# Patient Record
Sex: Male | Born: 1980 | Race: Black or African American | Hispanic: No | Marital: Married | Smoking: Current every day smoker
Health system: Southern US, Community
[De-identification: ages and names within clinical notes are randomized; demographics above are authoritative.]

## PROBLEM LIST (undated history)

## (undated) DIAGNOSIS — K219 Gastro-esophageal reflux disease without esophagitis: Secondary | ICD-10-CM

## (undated) DIAGNOSIS — R51 Headache: Secondary | ICD-10-CM

## (undated) DIAGNOSIS — I10 Essential (primary) hypertension: Secondary | ICD-10-CM

## (undated) DIAGNOSIS — R519 Headache, unspecified: Secondary | ICD-10-CM

## (undated) DIAGNOSIS — E785 Hyperlipidemia, unspecified: Secondary | ICD-10-CM

## (undated) DIAGNOSIS — Z9189 Other specified personal risk factors, not elsewhere classified: Secondary | ICD-10-CM

## (undated) HISTORY — DX: Essential (primary) hypertension: I10

## (undated) HISTORY — DX: Headache: R51

## (undated) HISTORY — DX: Other specified personal risk factors, not elsewhere classified: Z91.89

## (undated) HISTORY — DX: Headache, unspecified: R51.9

## (undated) HISTORY — DX: Gastro-esophageal reflux disease without esophagitis: K21.9

## (undated) HISTORY — DX: Hyperlipidemia, unspecified: E78.5

---
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
## 2005-11-25 ENCOUNTER — Emergency Department (HOSPITAL_COMMUNITY): Admission: EM | Admit: 2005-11-25 | Discharge: 2005-11-25 | Payer: Self-pay | Admitting: Emergency Medicine

## 2007-09-12 ENCOUNTER — Emergency Department (HOSPITAL_COMMUNITY): Admission: EM | Admit: 2007-09-12 | Discharge: 2007-09-12 | Payer: Self-pay | Admitting: Emergency Medicine

## 2009-05-03 ENCOUNTER — Emergency Department (HOSPITAL_COMMUNITY): Admission: EM | Admit: 2009-05-03 | Discharge: 2009-05-03 | Payer: Self-pay | Admitting: Emergency Medicine

## 2010-12-24 ENCOUNTER — Ambulatory Visit (INDEPENDENT_AMBULATORY_CARE_PROVIDER_SITE_OTHER): Payer: BC Managed Care – PPO

## 2010-12-24 ENCOUNTER — Inpatient Hospital Stay (INDEPENDENT_AMBULATORY_CARE_PROVIDER_SITE_OTHER)
Admission: RE | Admit: 2010-12-24 | Discharge: 2010-12-24 | Disposition: A | Discharge status: Home / Self Care | Payer: BC Managed Care – PPO | Source: Ambulatory Visit | Attending: Emergency Medicine | Admitting: Emergency Medicine

## 2010-12-24 DIAGNOSIS — S62309A Unspecified fracture of unspecified metacarpal bone, initial encounter for closed fracture: Secondary | ICD-10-CM

## 2011-01-21 ENCOUNTER — Emergency Department (HOSPITAL_COMMUNITY)
Admission: EM | Admit: 2011-01-21 | Discharge: 2011-01-22 | Disposition: A | Discharge status: Other Acute Inpatient Hospital | Payer: BC Managed Care – PPO | Attending: Emergency Medicine | Admitting: Emergency Medicine

## 2011-01-21 DIAGNOSIS — F17200 Nicotine dependence, unspecified, uncomplicated: Secondary | ICD-10-CM | POA: Insufficient documentation

## 2011-01-21 DIAGNOSIS — R45851 Suicidal ideations: Secondary | ICD-10-CM | POA: Insufficient documentation

## 2011-01-21 DIAGNOSIS — F3289 Other specified depressive episodes: Secondary | ICD-10-CM | POA: Insufficient documentation

## 2011-01-21 DIAGNOSIS — F329 Major depressive disorder, single episode, unspecified: Secondary | ICD-10-CM | POA: Insufficient documentation

## 2011-01-21 LAB — CBC
HCT: 46.1 % (ref 39.0–52.0)
Hemoglobin: 16.3 g/dL (ref 13.0–17.0)
MCH: 30 pg (ref 26.0–34.0)
MCHC: 35.4 g/dL (ref 30.0–36.0)
Platelets: 188 10*3/uL (ref 150–400)
RBC: 5.43 MIL/uL (ref 4.22–5.81)
RDW: 13.1 % (ref 11.5–15.5)

## 2011-01-21 LAB — POCT I-STAT, CHEM 8
BUN: 14 mg/dL (ref 6–23)
Calcium, Ion: 1.23 mmol/L (ref 1.12–1.32)
Chloride: 104 mEq/L (ref 96–112)
Creatinine, Ser: 1.2 mg/dL (ref 0.50–1.35)
Glucose, Bld: 89 mg/dL (ref 70–99)
Hemoglobin: 17.7 g/dL — ABNORMAL HIGH (ref 13.0–17.0)
Potassium: 3.8 mEq/L (ref 3.5–5.1)
Sodium: 144 mEq/L (ref 135–145)

## 2011-01-21 LAB — DIFFERENTIAL
Basophils Absolute: 0 10*3/uL (ref 0.0–0.1)
Basophils Relative: 0 % (ref 0–1)
Eosinophils Relative: 2 % (ref 0–5)

## 2011-01-21 LAB — RAPID URINE DRUG SCREEN, HOSP PERFORMED
Amphetamines: NOT DETECTED
Barbiturates: NOT DETECTED
Tetrahydrocannabinol: NOT DETECTED

## 2011-01-22 ENCOUNTER — Inpatient Hospital Stay (HOSPITAL_COMMUNITY)
Admission: AD | Admit: 2011-01-22 | Discharge: 2011-01-28 | DRG: 430 | Disposition: A | Discharge status: Home / Self Care | Payer: BC Managed Care – PPO | Source: Ambulatory Visit | Attending: Psychiatry | Admitting: Psychiatry

## 2011-01-22 DIAGNOSIS — R45851 Suicidal ideations: Secondary | ICD-10-CM

## 2011-01-22 DIAGNOSIS — F1010 Alcohol abuse, uncomplicated: Secondary | ICD-10-CM

## 2011-01-22 DIAGNOSIS — F3130 Bipolar disorder, current episode depressed, mild or moderate severity, unspecified: Principal | ICD-10-CM

## 2011-01-22 DIAGNOSIS — Z818 Family history of other mental and behavioral disorders: Secondary | ICD-10-CM

## 2011-01-22 DIAGNOSIS — F429 Obsessive-compulsive disorder, unspecified: Secondary | ICD-10-CM

## 2011-01-23 DIAGNOSIS — F314 Bipolar disorder, current episode depressed, severe, without psychotic features: Secondary | ICD-10-CM

## 2011-01-23 DIAGNOSIS — F429 Obsessive-compulsive disorder, unspecified: Secondary | ICD-10-CM

## 2011-01-24 LAB — T4, FREE: Free T4: 1.18 ng/dL (ref 0.80–1.80)

## 2011-01-27 LAB — LITHIUM LEVEL: Lithium Lvl: 0.44 mEq/L — ABNORMAL LOW (ref 0.80–1.40)

## 2011-01-28 ENCOUNTER — Other Ambulatory Visit (HOSPITAL_COMMUNITY): Payer: BC Managed Care – PPO | Admitting: Psychiatry

## 2011-01-29 ENCOUNTER — Other Ambulatory Visit (HOSPITAL_COMMUNITY): Payer: BC Managed Care – PPO | Attending: Psychiatry | Admitting: Psychiatry

## 2011-01-29 DIAGNOSIS — Z818 Family history of other mental and behavioral disorders: Secondary | ICD-10-CM | POA: Insufficient documentation

## 2011-01-29 DIAGNOSIS — Z6379 Other stressful life events affecting family and household: Secondary | ICD-10-CM | POA: Insufficient documentation

## 2011-01-29 DIAGNOSIS — F3160 Bipolar disorder, current episode mixed, unspecified: Secondary | ICD-10-CM | POA: Insufficient documentation

## 2011-01-29 DIAGNOSIS — F1010 Alcohol abuse, uncomplicated: Secondary | ICD-10-CM | POA: Insufficient documentation

## 2011-01-29 DIAGNOSIS — F429 Obsessive-compulsive disorder, unspecified: Secondary | ICD-10-CM | POA: Insufficient documentation

## 2011-01-30 ENCOUNTER — Other Ambulatory Visit (HOSPITAL_COMMUNITY): Payer: BC Managed Care – PPO | Admitting: Psychiatry

## 2011-01-31 ENCOUNTER — Other Ambulatory Visit (HOSPITAL_COMMUNITY): Payer: BC Managed Care – PPO | Admitting: Psychiatry

## 2011-02-01 ENCOUNTER — Other Ambulatory Visit (HOSPITAL_COMMUNITY): Payer: BC Managed Care – PPO | Admitting: Psychiatry

## 2011-02-04 ENCOUNTER — Other Ambulatory Visit (HOSPITAL_BASED_OUTPATIENT_CLINIC_OR_DEPARTMENT_OTHER): Payer: BC Managed Care – PPO | Admitting: Psychiatry

## 2011-02-04 DIAGNOSIS — F319 Bipolar disorder, unspecified: Secondary | ICD-10-CM

## 2011-02-05 ENCOUNTER — Other Ambulatory Visit (HOSPITAL_COMMUNITY): Payer: BC Managed Care – PPO | Admitting: Psychiatry

## 2011-02-05 NOTE — Assessment & Plan Note (Signed)
Donald Sweeney            ACCOUNT NO.:  [id number]  MEDICAL RECORD NO.:  [id number]  LOCATION:  0301                          FACILITY:  [facility]  PHYSICIAN:  Donald Ditch, MD DATE OF BIRTH:  [birth year]  DATE OF ADMISSION:  01/22/2011 DATE OF DISCHARGE:                      PSYCHIATRIC ADMISSION ASSESSMENT   IDENTIFICATION:  This is a 30 year old African American male.  This is a voluntary admission.  HISTORY OF PRESENT ILLNESS:  This is the first inpatient psychiatric admission for Donald Sweeney, a 30 year old, who presented at [facility] with increasing suicidal thoughts.  He attempted suicide last week by overdosing on pain pills and drinking to the point of intoxication.  He reports that CPR was being performed on him by hotel staff, but when he became fully oriented, he took off prior to EMS arriving.  He admits that he needs help and endorses a long history of depression.  He reports he has attempted suicide in the past, also by overdose, but has never been hospitalized or actually treated.  He denies substance abuse and denies any homicidal thoughts.  He does endorse having visions of him stabbing himself in the hand with a knife, has frequent flashes of harming himself and can visualize himself killing himself.  He reports episodes of increased motor activity from time to time that will last 1- 3 days at a time and are accompanied by racing thoughts.  Predominately, he has periods of very low mood with a hopelessness, anhedonia and constantly maintains a variety of suicidal thoughts in his head that he has not acted on.  PAST PSYCHIATRIC HISTORY:  First inpatient psychiatric admission.  He reports a history of depression as long as he can remember, even into childhood and when he never really understood the happiness the other children described, related to various events.  Never had a sense of excitement in his life.  His last suicide attempt was about 7 years  ago, then 1 week ago; both of them by overdose.  Trigger 7 years ago was getting out of the [employer].  He also describes a history of some of obsessive thinking including songs that get stuck in his head and that will bother him for 1-2 days.  He denies any history of substance abuse.  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  No legal problems.  FAMILY HISTORY:  Mother with a history of bipolar disorder, with multiple medication trials.Marland Kitchen  PAST MEDICAL HISTORY:  No regular primary care provider.  Denies abusing any substances.  PHYSICAL EXAMINATION:  GENERAL: He reports he is generally healthy, well- nourished, well-developed, appears in no distress.  Appears to be his stated age. NEURO: Motor is smooth.  No abnormal movements. ADMITTING VITAL SIGNS: Temperature 98.4, pulse 88, respirations 18, blood pressure 126/76.  LABORATORY DATA:  Urine drug screen negative for all substances. Chemistries: Normal.  BUN 14, creatinine 1.20.  Alcohol screen is negative.  CBC is normal.  Hemoglobin 16.3.  MENTAL STATUS EXAM:  GENERAL:  A fully alert male, pleasant cooperative, blunted affect.  Gives a coherent history.  Soft-spoken.  Appears depressed. THINKING:  Logical and coherent, nonpsychotic.  No delusional statements made.  Asking for help with his mood.  Recognizes that his mood is dangerous.  Admits that he has been suicidal and recognizes need for help. INSIGHT:  Quite good. INTELLIGENCE:  Average to above-average. IMPULSE CONTROL AND JUDGMENT:  Normal.  DIAGNOSES:  AXIS I:  Bipolar disorder:  Rule out type 1, currently depressed and rule out obsessive-compulsive disorder. AXIS II:  No diagnosis. AXIS III:  No diagnosis. AXIS IV:  Deferred. AXIS V:  Current 40.  Past year not known.  PLAN:  Voluntarily admit him with a goal of stabilizing his mood and alleviating his suicidal thoughts.  We have discussed a trial of  lithium and Lamictal, and he is in agreement with this.  Will start on Lamictal 25 mg p.o. q.h.s. and 300 mg b.i.d.  We are going to get a baseline EKG and TSH, free T4 and free T3.     Donald Sweeney, N.P.   ______________________________ Donald Ditch, MD    MAS/MEDQ  D:  01/24/2011  T:  01/24/2011  Job:  [id number]  Electronically Signed by Donald Sweeney N.P. on 01/25/2011 09:06:12 AM Electronically Signed by Donald Sweeney  on 02/05/2011 09:38:17 AM

## 2011-02-06 ENCOUNTER — Other Ambulatory Visit (HOSPITAL_COMMUNITY): Payer: BC Managed Care – PPO | Admitting: Psychiatry

## 2011-02-07 ENCOUNTER — Other Ambulatory Visit (HOSPITAL_COMMUNITY): Payer: BC Managed Care – PPO | Admitting: Psychiatry

## 2011-02-08 ENCOUNTER — Other Ambulatory Visit (HOSPITAL_COMMUNITY): Payer: BC Managed Care – PPO | Admitting: Psychiatry

## 2011-02-11 ENCOUNTER — Other Ambulatory Visit (HOSPITAL_COMMUNITY): Payer: BC Managed Care – PPO | Admitting: Psychiatry

## 2011-02-12 ENCOUNTER — Other Ambulatory Visit (HOSPITAL_COMMUNITY): Payer: BC Managed Care – PPO | Admitting: Psychiatry

## 2011-02-13 ENCOUNTER — Other Ambulatory Visit (HOSPITAL_COMMUNITY): Payer: BC Managed Care – PPO | Attending: Psychiatry | Admitting: Psychiatry

## 2011-02-13 DIAGNOSIS — Z818 Family history of other mental and behavioral disorders: Secondary | ICD-10-CM | POA: Insufficient documentation

## 2011-02-13 DIAGNOSIS — Z6379 Other stressful life events affecting family and household: Secondary | ICD-10-CM | POA: Insufficient documentation

## 2011-02-13 DIAGNOSIS — F3160 Bipolar disorder, current episode mixed, unspecified: Secondary | ICD-10-CM | POA: Insufficient documentation

## 2011-02-13 DIAGNOSIS — F1010 Alcohol abuse, uncomplicated: Secondary | ICD-10-CM | POA: Insufficient documentation

## 2011-02-13 DIAGNOSIS — F429 Obsessive-compulsive disorder, unspecified: Secondary | ICD-10-CM | POA: Insufficient documentation

## 2011-02-14 ENCOUNTER — Other Ambulatory Visit (HOSPITAL_COMMUNITY): Payer: BC Managed Care – PPO | Admitting: Psychiatry

## 2011-02-15 ENCOUNTER — Other Ambulatory Visit (HOSPITAL_BASED_OUTPATIENT_CLINIC_OR_DEPARTMENT_OTHER): Payer: BC Managed Care – PPO | Admitting: Psychiatry

## 2011-02-15 DIAGNOSIS — F319 Bipolar disorder, unspecified: Secondary | ICD-10-CM

## 2011-02-15 DIAGNOSIS — F429 Obsessive-compulsive disorder, unspecified: Secondary | ICD-10-CM

## 2011-02-15 NOTE — Discharge Summary (Addendum)
NAMENICKOLAOS, BRALLIER NO.:  [id number]  MEDICAL RECORD NO.:  [id number]  LOCATION:  PIOP                          FACILITY:  [facility]  PHYSICIAN:  Orson Aloe, MD       DATE OF BIRTH:  Dec 09, 1980  DATE OF ADMISSION:  01/29/2011 DATE OF DISCHARGE:                              DISCHARGE SUMMARY   This 30 year old African American is voluntarily admitted for his first inpatient psychiatric admission with increasing suicidal thoughts and attempted suicide with overdose with pain pills and drinking to the point of intoxication.  CPR was performed on him by hotel staff, and when he was fully oriented he took off prior to EMS arriving.  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  He has never been hospitalized or actually treated.  He denies substance abuse.  However, he has used alcohol frequently to dull the pain of his depression.  It seems that his mother had issues with substance abuse as well as her sister, and apparently her sister also had schizophrenia and either she gave to the mother or the aunt had been treated with medications and the other one abused substances to control their symptoms.  PERTINENT LABORATORY DATA:  In the emergency room, CBC and chem profile were entirely within normal limits.  FINAL DIAGNOSES:  Axis I: 1. Bipolar disorder I, most recent episode mixed. 2. Obsessive-compulsive disorder. 3. Alcohol abuse. Axis II:  Deferred. Axis III:  None noted. Axis IV:  Moderate occupational and life circumstances issues. Axis V:  45.  Highest in the last year is 40.  SIGNIFICANT FINDINGS:  A significant finding is that the patient has a great deal of Art gallery manager, is wanting to go to college and finish up some course work that he already started this fall.  He was able to respond extremely well to a group therapy setting.  He was able to give feedback to others that was quite  genuine, and was able to draw some conclusions and make some adjustments in his own thinking and life that seemed to be quite positive.  PROCEDURES PERFORMED AND TREATMENT RENDERED:  The patient was started on lithium 300 mg b.i.d., increased to 300 in the morning and 600 at nighttime, and Lamictal 25 mg twice a day to follow a traditional titration protocol.  The lithium was intended to help with his depressed aspect of his bipolar disorder, and the Lamictal also to help with his bipolar, but to further assist with his OCD issues.  His OCD was to the point where he would have a song stuck in his head for 1 to 2 days and that bothered him.  He also noted that if people touched or moved any of his items or possessions, particularly his computer, that would be extremely troublesome for him.  This is consistent with the OCD.  CONDITION ON DISCHARGE:  In specific measurable terms, in the patient's own words he states "thank you for the help and guidance with learning how to cope with my illness."  He describes his depression as a 1 being the least depressed on a scale from a 1  to 10 with 10 being the most depressed.  His anxiety and hopelessness were also significantly improved with 1 being the least hopeless and 10 being the most hopeless, he rated himself as a 1 on this scale.  He had a brighter affect, more confidence, carried himself in a way in which he was much more sitting vertically in a chair and looking people in the eye.  This was significantly different than the shame-based posture of his presentation on admission.  INSTRUCTIONS GIVEN TO THE PATIENT:  Follow up with the [facility] at 8:45 on the morning of the 17th.  Instructions related to physical activity, medications, diet and follow-up care was that he had no restrictions to physical activity, he was to follow up with the medication management with the psychiatrist in the outpatient program. Diet was as  tolerated.  His follow-up care with the Intensive Outpatient Program at [facility].          ______________________________ Orson Aloe, MD     EW/MEDQ  D:  01/29/2011  T:  01/29/2011  Job:  [id number]  Electronically Signed by Orson Aloe  on 02/20/2011 10:57:49 AM

## 2011-02-18 ENCOUNTER — Other Ambulatory Visit (HOSPITAL_COMMUNITY): Payer: BC Managed Care – PPO | Admitting: Psychiatry

## 2011-02-19 ENCOUNTER — Other Ambulatory Visit (HOSPITAL_COMMUNITY): Payer: BC Managed Care – PPO | Admitting: Psychiatry

## 2011-02-20 ENCOUNTER — Other Ambulatory Visit (HOSPITAL_COMMUNITY): Payer: BC Managed Care – PPO | Admitting: Psychiatry

## 2011-02-21 ENCOUNTER — Other Ambulatory Visit (HOSPITAL_COMMUNITY): Payer: BC Managed Care – PPO | Admitting: Psychiatry

## 2011-02-22 ENCOUNTER — Other Ambulatory Visit (HOSPITAL_COMMUNITY): Payer: BC Managed Care – PPO | Admitting: Psychiatry

## 2011-02-28 ENCOUNTER — Ambulatory Visit (HOSPITAL_BASED_OUTPATIENT_CLINIC_OR_DEPARTMENT_OTHER): Payer: BC Managed Care – PPO | Admitting: Psychology

## 2011-02-28 DIAGNOSIS — F314 Bipolar disorder, current episode depressed, severe, without psychotic features: Secondary | ICD-10-CM

## 2011-05-31 ENCOUNTER — Encounter (HOSPITAL_COMMUNITY): Payer: BC Managed Care – PPO | Admitting: Psychology

## 2016-12-04 ENCOUNTER — Encounter (HOSPITAL_COMMUNITY): Payer: Self-pay

## 2016-12-04 ENCOUNTER — Emergency Department (HOSPITAL_COMMUNITY): Payer: BLUE CROSS/BLUE SHIELD

## 2016-12-04 ENCOUNTER — Emergency Department (HOSPITAL_COMMUNITY)
Admission: EM | Admit: 2016-12-04 | Discharge: 2016-12-04 | Disposition: A | Discharge status: Home / Self Care | Payer: BLUE CROSS/BLUE SHIELD | Attending: Emergency Medicine | Admitting: Emergency Medicine

## 2016-12-04 DIAGNOSIS — R110 Nausea: Secondary | ICD-10-CM | POA: Insufficient documentation

## 2016-12-04 DIAGNOSIS — J9801 Acute bronchospasm: Secondary | ICD-10-CM | POA: Insufficient documentation

## 2016-12-04 DIAGNOSIS — F17200 Nicotine dependence, unspecified, uncomplicated: Secondary | ICD-10-CM | POA: Insufficient documentation

## 2016-12-04 DIAGNOSIS — J209 Acute bronchitis, unspecified: Secondary | ICD-10-CM | POA: Diagnosis not present

## 2016-12-04 DIAGNOSIS — R51 Headache: Secondary | ICD-10-CM | POA: Insufficient documentation

## 2016-12-04 DIAGNOSIS — R05 Cough: Secondary | ICD-10-CM | POA: Diagnosis not present

## 2016-12-04 MED ORDER — BENZONATATE 200 MG PO CAPS: 200.0000 mg | ORAL_CAPSULE | ORAL | 0 refills | Status: DC

## 2016-12-04 MED ORDER — GUAIFENESIN-CODEINE 100-10 MG/5ML PO SOLN: 5.0000 mL | Freq: Every evening | ORAL | 0 refills | Status: DC | PRN

## 2016-12-04 MED ORDER — ALBUTEROL SULFATE 4 MG PO TABS: 4.0000 mg | ORAL_TABLET | Freq: Three times a day (TID) | ORAL | 0 refills | Status: DC

## 2016-12-04 MED ORDER — PREDNISONE 20 MG PO TABS: 40.0000 mg | ORAL_TABLET | Freq: Every day | ORAL | 0 refills | Status: DC

## 2016-12-04 NOTE — Discharge Instructions (Signed)
Contact a health care provider if: Your symptoms do not improve in 2 weeks of treatment. Get help right away if: You cough up blood. You have chest pain. You have severe shortness of breath. You become dehydrated. You faint or keep feeling like you are going to faint. You keep vomiting. You have a severe headache. Your fever or chills gets wor

## 2016-12-04 NOTE — ED Triage Notes (Signed)
Pt complains of a productive cough for two weeks Denies fevers Pt states it hurts in his chest and ribs when he coughs

## 2016-12-04 NOTE — ED Provider Notes (Signed)
[facility]-EMERGENCY DEPT Provider Note   CSN: [id number] Arrival date & time: 12/04/16  2030  By signing my name below, I, Orpah Cobb, attest that this documentation has been prepared under the direction and in the presence of Arthor Captain, PA-C. Electronically Signed: Lyndon Sweeney., ED Scribe. 12/06/16. 1:15 AM.   History   Chief Complaint Chief Complaint  Patient presents with  . Cough    HPI Donald Sweeney is a 36 y.o. male who presents to the Emergency Department complaining of cough with onset x2 weeks. Pt states that for the past x2 weeks he has had a worsening productive cough. Per friend, pt has been complaining of chest pain with coughing. Pt states that he has had episodes of post-tussive emesis due to excessive cough. Pt states that the cough is worst a night. He reports nausea, headaches. Pt has taken Delsym with no relief. Pt denies any other complaints. He denies hx of asthma, GERD. Of note, pt states that he has been smoking to relieve the cough.    The history is provided by the patient and a friend. No language interpreter was used.    History reviewed. No pertinent past medical history.  There are no active problems to display for this patient.   History reviewed. No pertinent surgical history.     Home Medications    Prior to Admission medications   Medication Sig Start Date End Date Taking? Authorizing Provider  albuterol (PROVENTIL) 4 MG tablet Take 1 tablet (4 mg total) by mouth 3 (three) times daily. 12/04/16   Arthor Captain, PA-C  benzonatate (TESSALON) 200 MG capsule Take 1 capsule (200 mg total) by mouth every morning. 12/04/16   Arthor Captain, PA-C  guaiFENesin-codeine 100-10 MG/5ML syrup Take 5-10 mLs by mouth at bedtime and may repeat dose one time if needed. 12/04/16   Jerah Esty, Cammy Copa, PA-C  predniSONE (DELTASONE) 20 MG tablet Take 2 tablets (40 mg total) by mouth daily. 12/04/16   Arthor Captain, PA-C    Family History History  reviewed. No pertinent family history.  Social History Social History  Substance Use Topics  . Smoking status: Current Every Day Smoker  . Smokeless tobacco: Never Used  . Alcohol use No     Allergies   Patient has no allergy information on record.   Review of Systems Review of Systems  Constitutional: Negative for fever.  Respiratory: Positive for cough.   Gastrointestinal: Positive for nausea.  Neurological: Positive for headaches.     Physical Exam Updated Vital Signs BP (!) 149/86 (BP Location: Right Arm)   Pulse 83   Temp 98.4 F (36.9 C) (Oral)   Resp 18   SpO2 100%   Physical Exam  Constitutional: He appears well-developed and well-nourished.  HENT:  Head: Normocephalic and atraumatic.  Eyes: Conjunctivae are normal.  Neck: Neck supple.  Cardiovascular: Normal rate and regular rhythm.   No murmur heard. Pulmonary/Chest: Effort normal and breath sounds normal. No respiratory distress.  Breath sounds are normal. Pt with barky, painful-sounding cough.  Abdominal: Soft. There is no tenderness.  Musculoskeletal: He exhibits no edema.  Neurological: He is alert.  Skin: Skin is warm and dry.  Psychiatric: He has a normal mood and affect.  Nursing note and vitals reviewed.    ED Treatments / Results   DIAGNOSTIC STUDIES: Oxygen Saturation is 100% on RA, normal by my interpretation.   COORDINATION OF CARE: 1:15 AM-Discussed next steps with pt. Pt verbalized understanding and is agreeable with the plan.  Labs (all labs ordered are listed, but only abnormal results are displayed) Labs Reviewed - No data to display  EKG  EKG Interpretation None       Radiology Dg Chest 2 View  Result Date: 12/04/2016 CLINICAL DATA:  Productive cough for 2 weeks. Chest and ribs hurt while coughing. Smoker. EXAM: CHEST  2 VIEW COMPARISON:  None. FINDINGS: The heart size and mediastinal contours are within normal limits. Both lungs are clear. The visualized  skeletal structures are unremarkable. IMPRESSION: No active cardiopulmonary disease. Electronically Signed   By: Burman NievesWilliam  Stevens M.D.   On: 12/04/2016 21:27    Procedures Procedures (including critical care time)  Medications Ordered in ED Medications - No data to display   Initial Impression / Assessment and Plan / ED Course  I have reviewed the triage vital signs and the nursing notes.  Pertinent labs & imaging results that were available during my care of the patient were reviewed by me and considered in my medical decision making (see chart for details).     Pt CXR negative for acute infiltrate. Patients symptoms are consistent with URI, likely viral etiology. Discussed that antibiotics are not indicated for viral infections. Pt will be discharged with symptomatic treatment.  Verbalizes understanding and is agreeable with plan. Pt is hemodynamically stable & in NAD prior to dc.   Final Clinical Impressions(s) / ED Diagnoses   Final diagnoses:  Bronchospasm with bronchitis, acute    New Prescriptions Discharge Medication List as of 12/04/2016  9:48 PM    START taking these medications   Details  albuterol (PROVENTIL) 4 MG tablet Take 1 tablet (4 mg total) by mouth 3 (three) times daily., Starting Wed 12/04/2016, Print    benzonatate (TESSALON) 200 MG capsule Take 1 capsule (200 mg total) by mouth every morning., Starting Wed 12/04/2016, Print    guaiFENesin-codeine 100-10 MG/5ML syrup Take 5-10 mLs by mouth at bedtime and may repeat dose one time if needed., Starting Wed 12/04/2016, Print    predniSONE (DELTASONE) 20 MG tablet Take 2 tablets (40 mg total) by mouth daily., Starting Wed 12/04/2016, Print           RockwoodHarris, Mud BayAbigail, PA-C 12/06/16 0116    Tegeler, Canary Brimhristopher J, MD 12/11/16 1254

## 2018-04-14 ENCOUNTER — Ambulatory Visit (INDEPENDENT_AMBULATORY_CARE_PROVIDER_SITE_OTHER): Payer: Medicare Other | Admitting: Family Medicine

## 2018-04-14 ENCOUNTER — Encounter: Payer: Self-pay | Admitting: Family Medicine

## 2018-04-14 VITALS — BP 124/82 | HR 76 | Temp 98.3°F | Ht 72.0 in | Wt 279.0 lb

## 2018-04-14 DIAGNOSIS — R29898 Other symptoms and signs involving the musculoskeletal system: Secondary | ICD-10-CM

## 2018-04-14 DIAGNOSIS — R5383 Other fatigue: Secondary | ICD-10-CM | POA: Diagnosis not present

## 2018-04-14 DIAGNOSIS — Z13220 Encounter for screening for lipoid disorders: Secondary | ICD-10-CM

## 2018-04-14 DIAGNOSIS — Z23 Encounter for immunization: Secondary | ICD-10-CM | POA: Diagnosis not present

## 2018-04-14 DIAGNOSIS — Z131 Encounter for screening for diabetes mellitus: Secondary | ICD-10-CM | POA: Diagnosis not present

## 2018-04-14 DIAGNOSIS — Z114 Encounter for screening for human immunodeficiency virus [HIV]: Secondary | ICD-10-CM | POA: Diagnosis not present

## 2018-04-14 DIAGNOSIS — G5623 Lesion of ulnar nerve, bilateral upper limbs: Secondary | ICD-10-CM | POA: Insufficient documentation

## 2018-04-14 NOTE — Assessment & Plan Note (Signed)
Unclear etiology.  Exam today essentially normal.  High suspicion for neurological issue given bilateral distribution.  Will likely need imaging of C-spine and brain and blood work.  Patient declined blood work today.  He will check with his insurance and come back if it is covered.  Discussed diagnostic options with patient.  Will place referral to neurology for further evaluation.

## 2018-04-14 NOTE — Patient Instructions (Signed)
It was very nice to see you today!  We will refer you to a neurologist for further testing.   You do not have any major abnormalities on your exam.  We will need to get further testing to figure out what is going on.  I would like to check several blood tests soon including a CBC, TSH, complete metabolic panel, hemoglobin A1c, and a lipid panel.  Please come back soon to have these checked.  Take care, Dr Jimmey Ralph

## 2018-04-14 NOTE — Progress Notes (Signed)
Subjective:  Donald Sweeney is a 37 y.o. male who presents today with a chief complaint of weakness and to establish care.  HPI:  Weakness, new problem Symptoms started a few months ago.  Located to his bilateral hands and upper extremities.  Symptoms have progressively worsened.  They are intermittent in nature.  He has noticed that he has had increased difficulty grasping pencils and other household utensils.  Also increased difficulty holding things.  He has some numbness, but no tingling.  No pain.  Patient is right-handed.  No obvious precipitating events.  No history of trauma.  No medication changes.  No fevers or chills.  No rashes.  He has had random "dislocations" of his shoulders, hips, and knees however the past several years and some chronic low back pain but no other areas of pain.  ROS: Per HPI, otherwise a complete review of systems was negative.   PMH:  The following were reviewed and entered/updated in epic: Past Medical History:  Diagnosis Date  . Frequent headaches   . GERD (gastroesophageal reflux disease)   . History of fainting spells of unknown cause   . Hyperlipidemia   . Hypertension    Patient Active Problem List   Diagnosis Date Noted  . Weakness of both hands 04/14/2018   History reviewed. No pertinent surgical history.  Family History  Problem Relation Age of Onset  . Stroke Maternal Grandmother   . Hypertension Maternal Grandmother   . Heart disease Maternal Grandfather   . Lung cancer Paternal Grandfather   . Epilepsy Maternal Uncle     Medications- reviewed and updated No current outpatient medications on file.   No current facility-administered medications for this visit.     Allergies-reviewed and updated Allergies  Allergen Reactions  . Fish Allergy   . Shellfish Allergy     Social History   Socioeconomic History  . Marital status: Single    Spouse name: Not on file  . Number of children: Not on file  . Years of  education: Not on file  . Highest education level: Not on file  Occupational History  . Not on file  Social Needs  . Financial resource strain: Not on file  . Food insecurity:    Worry: Not on file    Inability: Not on file  . Transportation needs:    Medical: Not on file    Non-medical: Not on file  Tobacco Use  . Smoking status: Current Every Day Smoker    Types: Cigarettes  . Smokeless tobacco: Never Used  Substance and Sexual Activity  . Alcohol use: Yes    Comment: Rarely  . Drug use: No  . Sexual activity: Not on file  Lifestyle  . Physical activity:    Days per week: Not on file    Minutes per session: Not on file  . Stress: Not on file  Relationships  . Social connections:    Talks on phone: Not on file    Gets together: Not on file    Attends religious service: Not on file    Active member of club or organization: Not on file    Attends meetings of clubs or organizations: Not on file    Relationship status: Not on file  Other Topics Concern  . Not on file  Social History Narrative  . Not on file    Objective:  Physical Exam: BP 124/82 (BP Location: Left Arm, Patient Position: Sitting, Cuff Size: Large)   Pulse 76  Temp 98.3 F (36.8 C) (Oral)   Ht 6' (1.829 m)   Wt 279 lb (126.6 kg)   SpO2 99%   BMI 37.84 kg/m   Gen: NAD, resting comfortably CV: RRR with no murmurs appreciated Pulm: NWOB, CTAB with no crackles, wheezes, or rhonchi GI: Normal bowel sounds present. Soft, Nontender, Nondistended. MSK: No edema, cyanosis, or clubbing noted.  Bilateral shoulders, hips, and knees within normal limits.  No noted effusions or obvious deformities.  Strength 5 out of 5 at shoulder, hips, and knees in all directions. Skin: Warm, dry Neuro: Cranial nerves II through XII intact.  Strength 5 out of 5 in upper and lower extremities.  Sensation light touch intact throughout.  Finger-nose-finger testing intact bilaterally. Psych: Normal affect and thought  content  Assessment/Plan:  Weakness of both hands Unclear etiology.  Exam today essentially normal.  High suspicion for neurological issue given bilateral distribution.  Will likely need imaging of C-spine and brain and blood work.  Patient declined blood work today.  He will check with his insurance and come back if it is covered.  Discussed diagnostic options with patient.  Will place referral to neurology for further evaluation.  Preventive health care Flu shot given today.  Katina Degree. Jimmey Ralph, MD 04/14/2018 12:29 PM

## 2018-05-22 ENCOUNTER — Encounter: Payer: Self-pay | Admitting: Neurology

## 2018-08-06 NOTE — Progress Notes (Signed)
[facility] Clinic Note - Initial Visit   Date: 08/07/18  Labaron Feria MRN: [id number] DOB: 12/14/1980   Dear Dr. Jimmey Ralph:  Thank you for your kind referral of Aadith Pridmore for consultation of bilateral hand weakness. Although his history is well known to you, please allow [place] to reiterate it for the purpose of our medical record. The patient was accompanied to the clinic by self.    History of Present Illness: Donald Sweeney is a 38 y.o. right-handed African American male with hypertension, hyperlipidemia, GERD, tobacco use, bipolar disorder presenting for evaluation of bilateral hand weakness.    Starting around the spring of 2019, he began having intermittent spells of weakness in the hands.  Sometimes, he drops objects such as his phone or a pencil.  He feels that in his mind, he is holding items, but only realizes after it has fallen that he has dropped something.  He also complaints of numbness/tingling of the hands, which occurs more often than the weakness. He rarely wakes up with his hands asleep.  He has mild neck stiffness, no radicular pain.  It occurs about 2-3 times per month, without any specific triggers.   He is on disability since 2013.  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  No family history of carpal tunnel syndrome.   Out-side paper records, electronic medical record, and images have been reviewed where available and summarized as:  Lab Results  Component Value Date   TSH 2.560 01/24/2011     Past Medical History:  Diagnosis Date  . Frequent headaches   . GERD (gastroesophageal reflux disease)   . History of fainting spells of unknown cause   . Hyperlipidemia   . Hypertension     History reviewed. No pertinent surgical history.   Medications:  No outpatient encounter medications on file as of 08/07/2018.   No facility-administered encounter medications on file as of 08/07/2018.       Allergies:  Allergies  Allergen Reactions  . Fish Allergy   . Shellfish Allergy     Family History: Family History  Problem Relation Age of Onset  . Stroke Maternal Grandmother   . Hypertension Maternal Grandmother   . Heart disease Maternal Grandfather   . Lung cancer Paternal Grandfather   . Epilepsy Maternal Uncle   . Hypertension Mother   . Stroke Mother   . Seizures Mother   . Hypertension Father     Social History: Social History   Tobacco Use  . Smoking status: Current Every Day Smoker    Types: Cigarettes  . Smokeless tobacco: Never Used  Substance Use Topics  . Alcohol use: Yes    Comment: Rarely  . Drug use: No   Social History   Social History Narrative   Lives with wife, mother and 3 children in a one story home.  On disability since 2013 for bipolar disease and auditory hallucinations.     He was previously working in Clinical biochemist about 7 years.    Education: associates degree.     Review of Systems:  CONSTITUTIONAL: No fevers, chills, night sweats, or weight loss.   EYES: No visual changes or eye pain ENT: No hearing changes.  No history of nose bleeds.   RESPIRATORY: No cough, wheezing and shortness of breath.   CARDIOVASCULAR: Negative for chest pain, and palpitations.   GI: Negative for abdominal discomfort, blood in stools or black stools.  No recent change in bowel habits.  GU:  No history of incontinence.   MUSCLOSKELETAL: No history of joint pain or swelling.  No myalgias.   SKIN: Negative for lesions, rash, and itching.   HEMATOLOGY/ONCOLOGY: Negative for prolonged bleeding, bruising easily, and swollen nodes.  No history of cancer.   ENDOCRINE: Negative for cold or heat intolerance, polydipsia or goiter.   PSYCH:  +depression or anxiety symptoms.   NEURO: As Above.   Vital Signs:  BP 110/90   Pulse 77   Ht 6' (1.829 m)   Wt 285 lb 8 oz (129.5 kg)   SpO2 99%   BMI 38.72 kg/m    General Medical Exam:   General:  Well  appearing, comfortable.   Eyes/ENT: see cranial nerve examination.   Neck: No masses appreciated.  Full range of motion without tenderness.  No carotid bruits. Respiratory:  Clear to auscultation, good air entry bilaterally.   Cardiac:  Regular rate and rhythm, no murmur.   Extremities:  No deformities, edema, or skin discoloration.  Skin:  No rashes or lesions.  Neurological Exam: MENTAL STATUS including orientation to time, place, person, recent and remote memory, attention span and concentration, language, and fund of knowledge is normal.  Speech is not dysarthric.  CRANIAL NERVES: II:  No visual field defects.  Unremarkable fundi.   III-IV-VI: Pupils equal round and reactive to light.  Normal conjugate, extra-ocular eye movements in all directions of gaze.  No nystagmus.  No ptosis.   V:  Normal facial sensation.     VII:  Normal facial symmetry and movements.   VIII:  Normal hearing and vestibular function.   IX-X:  Normal palatal movement.   XI:  Normal shoulder shrug and head rotation.   XII:  Normal tongue strength and range of motion, no deviation or fasciculation.  MOTOR:  No atrophy, fasciculations or abnormal movements.  No pronator drift.  Tone is normal.    Right Upper Extremity:    Left Upper Extremity:    Deltoid  5/5   Deltoid  5/5   Biceps  5/5   Biceps  5/5   Triceps  5/5   Triceps  5/5   Wrist extensors  5/5   Wrist extensors  5/5   Wrist flexors  5/5   Wrist flexors  5/5   Finger extensors  5/5   Finger extensors  5/5   Finger flexors  5/5   Finger flexors  5/5   Dorsal interossei  5-/5   Dorsal interossei  5-/5   Abductor pollicis  5/5   Abductor pollicis  5/5   Tone (Ashworth scale)  0  Tone (Ashworth scale)  0   Right Lower Extremity:    Left Lower Extremity:    Hip flexors  5/5   Hip flexors  5/5   Hip extensors  5/5   Hip extensors  5/5   Knee flexors  5/5   Knee flexors  5/5   Knee extensors  5/5   Knee extensors  5/5   Dorsiflexors  5/5    Dorsiflexors  5/5   Plantarflexors  5/5   Plantarflexors  5/5   Toe extensors  5/5   Toe extensors  5/5   Toe flexors  5/5   Toe flexors  5/5   Tone (Ashworth scale)  0  Tone (Ashworth scale)  0   MSRs:  Reflexes are 1+/4 throughout.  Plantars are downgoing.  SENSORY:  Mildly diminished pin prick and temperature over the medial aspect of both hands.  Otherwise, normal and symmetric perception of light touch, pinprick, vibration, and proprioception.  Tinel's sign is mildly positive at the medial elbow bilaterally.  Tinel's sign is negative that the wrist.   COORDINATION/GAIT: Normal finger-to- nose-finger.  Intact rapid alternating movements bilaterally.  Gait narrow based and stable. Tandem and stressed gait intact.    IMPRESSION: Bilateral hand paresthesias and intermittent weakness.  Exam is suggestive of ulnar neuropathy.  He will undergo NCS/EMG of both arms to better localize symptoms.  Discussed strategies to minimize nerve impingement related to entrapment at the wrist and elbow to see if this helps in the meantime.   Further recommendations pending results.   Thank you for allowing me to participate in patient's care.  If I can answer any additional questions, I would be pleased to do so.    Sincerely,    Donika K. Allena KatzPatel, DO

## 2018-08-07 ENCOUNTER — Ambulatory Visit (INDEPENDENT_AMBULATORY_CARE_PROVIDER_SITE_OTHER): Payer: Medicare Other | Admitting: Neurology

## 2018-08-07 ENCOUNTER — Encounter: Payer: Self-pay | Admitting: Neurology

## 2018-08-07 VITALS — BP 110/90 | HR 77 | Ht 72.0 in | Wt 285.5 lb

## 2018-08-07 DIAGNOSIS — R202 Paresthesia of skin: Secondary | ICD-10-CM | POA: Diagnosis not present

## 2018-08-07 NOTE — Patient Instructions (Signed)
NCS/EMG of both arms.   ELECTROMYOGRAM AND NERVE CONDUCTION STUDIES (EMG/NCS) INSTRUCTIONS  How to Prepare The neurologist conducting the EMG will need to know if you have certain medical conditions. Tell the neurologist and other EMG lab personnel if you: . Have a pacemaker or any other electrical medical device . Take blood-thinning medications . Have hemophilia, a blood-clotting disorder that causes prolonged bleeding Bathing Take a shower or bath shortly before your exam in order to remove oils from your skin. Don't apply lotions or creams before the exam.  What to Expect You'll likely be asked to change into a hospital gown for the procedure and lie down on an examination table. The following explanations can help you understand what will happen during the exam.  . Electrodes. The neurologist or a technician places surface electrodes at various locations on your skin depending on where you're experiencing symptoms. Or the neurologist may insert needle electrodes at different sites depending on your symptoms.  . Sensations. The electrodes will at times transmit a tiny electrical current that you may feel as a twinge or spasm. The needle electrode may cause discomfort or pain that usually ends shortly after the needle is removed. If you are concerned about discomfort or pain, you may want to talk to the neurologist about taking a short break during the exam.  . Instructions. During the needle EMG, the neurologist will assess whether there is any spontaneous electrical activity when the muscle is at rest - activity that isn't present in healthy muscle tissue - and the degree of activity when you slightly contract the muscle.  He or she will give you instructions on resting and contracting a muscle at appropriate times. Depending on what muscles and nerves the neurologist is examining, he or she may ask you to change positions during the exam.  After your EMG You may experience some temporary,  minor bruising where the needle electrode was inserted into your muscle. This bruising should fade within several days. If it persists, contact your primary care doctor.

## 2018-08-17 ENCOUNTER — Ambulatory Visit (HOSPITAL_COMMUNITY)
Admission: EM | Admit: 2018-08-17 | Discharge: 2018-08-17 | Disposition: A | Discharge status: Home / Self Care | Payer: BLUE CROSS/BLUE SHIELD | Attending: Family Medicine | Admitting: Family Medicine

## 2018-08-17 ENCOUNTER — Encounter (HOSPITAL_COMMUNITY): Payer: Self-pay | Admitting: Emergency Medicine

## 2018-08-17 ENCOUNTER — Other Ambulatory Visit: Payer: Self-pay

## 2018-08-17 ENCOUNTER — Ambulatory Visit (INDEPENDENT_AMBULATORY_CARE_PROVIDER_SITE_OTHER): Payer: BLUE CROSS/BLUE SHIELD

## 2018-08-17 DIAGNOSIS — S60221A Contusion of right hand, initial encounter: Secondary | ICD-10-CM

## 2018-08-17 DIAGNOSIS — M79641 Pain in right hand: Secondary | ICD-10-CM

## 2018-08-17 DIAGNOSIS — W2209XA Striking against other stationary object, initial encounter: Secondary | ICD-10-CM | POA: Diagnosis not present

## 2018-08-17 NOTE — ED Provider Notes (Signed)
[facility]   [id number] 08/17/18 Arrival Time: 1124  ASSESSMENT & PLAN:  1. Contusion of right hand, initial encounter    I have personally viewed the imaging studies ordered this visit. No fractures appreciated. Discussed. Recommend ibuprofen regularly over the next several days. Expect gradual improvement.  Follow-up Information    Ardith Dark, MD.   Specialty:  Family Medicine Why:  As needed. Contact information: [address] [place] [address] [phone]          Reviewed expectations re: course of current medical issues. Questions answered. Outlined signs and symptoms indicating need for more acute intervention. Patient verbalized understanding. After Visit Summary given.  SUBJECTIVE: History from: patient. Donald Sweeney is a 38 y.o. male who reports fairly persentent mild to moderate pain of his right dorsal hand; described as aching without radiation. Onset: abrupt, approx 1 week ago. Injury/trama: yes, reports carrying his son, tripping, and hitting his R hand on a door frame; immediate pain. Symptoms have progressed to a point and plateaued since beginning. Aggravating factors: movements/gripping with R hand. Alleviating factors: rest. Associated symptoms: none reported. Extremity sensation changes or weakness: none. Self treatment: has not tried OTCs for relief of pain. History of similar: no.  History reviewed. No pertinent surgical history.   ROS: As per HPI.   OBJECTIVE:  Vitals:   08/17/18 1224  BP: 131/83  Pulse: 72  Resp: 20  Temp: 98.1 F (36.7 C)  TempSrc: Temporal  SpO2: 100%    General appearance: alert; no distress HEENT: [place]; AT Extremities: . RUE: warm and well perfused; poorly localized mild to moderate tenderness over right dorsal hand; without gross deformities; with no swelling; with no bruising; ROM: normal CV: brisk extremity capillary refill of RUE; 2+ radial pulse of RUE. Skin: warm and dry; no  visible rashes Neurologic: gait normal; normal reflexes of RUE and LUE; normal sensation of RUE and LUE; normal strength of RUE and LUE Psychological: alert and cooperative; normal mood and affect  Imaging: Dg Hand Complete Right  Result Date: 08/17/2018 CLINICAL DATA:  Pain following injury 1 week prior EXAM: RIGHT HAND - COMPLETE 3+ VIEW COMPARISON:  December 24, 2010 FINDINGS: Frontal, oblique, and lateral views were obtained. Patient has had a prior fracture of the distal fifth metacarpal with remodeling. There is no appreciable acute fracture or dislocation. Joint spaces appear normal. No erosive change. IMPRESSION: Old fracture fifth distal metacarpal with remodeling. No acute fracture or dislocation. No appreciable arthropathy. Electronically Signed   By: Bretta Bang III M.D.   On: 08/17/2018 13:30    Allergies  Allergen Reactions  . Fish Allergy   . Shellfish Allergy     Past Medical History:  Diagnosis Date  . Frequent headaches   . GERD (gastroesophageal reflux disease)   . History of fainting spells of unknown cause   . Hyperlipidemia   . Hypertension    Social History   Socioeconomic History  . Marital status: Married    Spouse name: Not on file  . Number of children: 3  . Years of education: 72  . Highest education level: Associate degree: occupational, Scientist, product/process development, or vocational program  Occupational History  . Occupation: on disability  Social Needs  . Financial resource strain: Not on file  . Food insecurity:    Worry: Not on file    Inability: Not on file  . Transportation needs:    Medical: Not on file    Non-medical: Not on file  Tobacco Use  .  Smoking status: Current Every Day Smoker    Types: Cigarettes  . Smokeless tobacco: Never Used  Substance and Sexual Activity  . Alcohol use: Yes    Comment: Rarely  . Drug use: No  . Sexual activity: Not on file  Lifestyle  . Physical activity:    Days per week: Not on file    Minutes per session: Not on  file  . Stress: Not on file  Relationships  . Social connections:    Talks on phone: Not on file    Gets together: Not on file    Attends religious service: Not on file    Active member of club or organization: Not on file    Attends meetings of clubs or organizations: Not on file    Relationship status: Not on file  Other Topics Concern  . Not on file  Social History Narrative   Lives with wife, mother and 3 children in a one story home.  On disability since 2013 for bipolar disease and auditory hallucinations.     He was previously working in Clinical biochemist about 7 years.    Education: associates degree.    Family History  Problem Relation Age of Onset  . Stroke Maternal Grandmother   . Hypertension Maternal Grandmother   . Heart disease Maternal Grandfather   . Lung cancer Paternal Grandfather   . Epilepsy Maternal Uncle   . Hypertension Mother   . Stroke Mother   . Seizures Mother   . Hypertension Father    History reviewed. No pertinent surgical history.    Mardella Layman, MD 08/17/18 1344

## 2018-08-17 NOTE — ED Triage Notes (Signed)
Patient was carrying handicapped son, tripped, and fell into wall to keep from dropping son.  Pain in palm of hand and wrist.

## 2018-08-20 ENCOUNTER — Ambulatory Visit (INDEPENDENT_AMBULATORY_CARE_PROVIDER_SITE_OTHER): Payer: Medicare Other | Admitting: Neurology

## 2018-08-20 DIAGNOSIS — R202 Paresthesia of skin: Secondary | ICD-10-CM

## 2018-08-20 NOTE — Progress Notes (Signed)
    Follow-up Visit   Date: target organ damage    Donald Sweeney MRN: [id number] DOB: [birth year]   Interim History: Donald Sweeney is a 38 y.o. right-handed African American male with hypertension, hyperlipidemia, GERD, tobacco use, bipolar disorder returning to the clinic for follow-up of bilateral hand numbness and weakness.  He is here to undergo electrodiagnostic testing and review results.  He continues to have intermittent spells of hand weakness and numbness, often dropping objects.  No radicular pain.   Medications:  No current outpatient medications on file prior to visit.   No current facility-administered medications on file prior to visit.     Allergies:  Allergies  Allergen Reactions  . Fish Allergy   . Shellfish Allergy       Vital Signs:  There were no vitals taken for this visit.   Neurological Exam: MENTAL STATUS including orientation to time, place, person, recent and remote memory, attention span and concentration, language, and fund of knowledge is normal.  Speech is not dysarthric.  CRANIAL NERVES:.  Face is symmetric.   MOTOR:  Motor strength is 5/5 in all extremities, except 5-/5 finger abductors.  No atrophy, fasciculations or abnormal movements.  No pronator drift.  Tone is normal.    COORDINATION/GAIT:   Gait narrow based and stable.   Data: NCS/EMG of the arms 08/20/2018:  Bilateral ulnar neuropathy with slowing across the elbow, purely demyelinating in type and mild in degree electrically.  IMPRESSION/PLAN: Bilateral cubital tunnel syndrome, which is mild.  Discussed the nature of ulnar nerve entrapment and strategies to minimize compression and stretching of the nerve at the medial elbow.  He was encouraged to use a soft elbow pad and pay attention to arm positioning.   Thank you for allowing me to participate in patient's care.  If I can answer any additional questions, I would be pleased to do so.    Sincerely,    Donika K.  Allena Katz, DO

## 2018-08-20 NOTE — Procedures (Signed)
[facility]  [address], [address]  [place], [place] [address] Tel: [phone] Fax:  [phone] Test Date:  08/20/2018  Patient: Donald AusChristopher Bossler DOB: 11/11/1980 Physician: Nita Sickleonika Liandro Thelin, DO  Sex: Male Height: 6\' 0"  Ref Phys: Nita Sickleonika Arilynn Blakeney, DO  ID#: [id number] Temp: 37.0C Technician:    Patient Complaints: This is a 38 year old man referred for evaluation of bilateral hand paresthesias and weakness.  NCV & EMG Findings: Extensive electrodiagnostic testing of the right upper extremity and additional studies of the left shows:  1. Bilateral median, ulnar, and mixed palmar sensory responses are within normal limits. 2. Bilateral median motor responses are within normal limits.  Bilateral ulnar motor responses show decreased conduction velocity across the elbow (A Elbow-B Elbow, L43, R48 m/s).   3. There is no evidence of active or chronic motor axonal loss changes affecting any of the tested muscles.  Motor unit configuration and recruitment pattern is within normal limits.    Impression: Bilateral ulnar neuropathy with slowing across the elbow, purely demyelinating in type and mild in degree electrically.   ___________________________ Nita Sickleonika Abbie Jablon, DO    Nerve Conduction Studies Anti Sensory Summary Table   Site NR Peak (ms) Norm Peak (ms) P-T Amp (V) Norm P-T Amp  Left Median Anti Sensory (2nd Digit)  37C  Wrist    3.1 <3.4 35.2 >20  Right Median Anti Sensory (2nd Digit)  37C  Wrist    2.9 <3.4 35.9 >20  Left Ulnar Anti Sensory (5th Digit)  37C  Wrist    2.7 <3.1 31.4 >12  Right Ulnar Anti Sensory (5th Digit)  37C  Wrist    2.6 <3.1 33.8 >12   Motor Summary Table   Site NR Onset (ms) Norm Onset (ms) O-P Amp (mV) Norm O-P Amp Site1 Site2 Delta-0 (ms) Dist (cm) Vel (m/s) Norm Vel (m/s)  Left Median Motor (Abd Poll Brev)  37C  Wrist    2.7 <3.9 10.1 >6 Elbow Wrist 5.5 33.0 60 >50  Elbow    8.2  9.6         Right Median Motor (Abd Poll Brev)  37C    Wrist    2.8 <3.9 10.3 >6 Elbow Wrist 4.9 33.0 67 >50  Elbow    7.7  8.8         Left Ulnar Motor (Abd Dig Minimi)  37C  Wrist    2.3 <3.1 9.8 >7 B Elbow Wrist 4.2 28.0 67 >50  B Elbow    6.5  9.1  A Elbow B Elbow 2.3 10.0 43 >50  A Elbow    8.8  8.6         Right Ulnar Motor (Abd Dig Minimi)  37C  Wrist    1.9 <3.1 8.6 >7 B Elbow Wrist 4.4 27.0 61 >50  B Elbow    6.3  7.2  A Elbow B Elbow 2.1 10.0 48 >50  A Elbow    8.4  6.3          Comparison Summary Table   Site NR Peak (ms) Norm Peak (ms) P-T Amp (V) Site1 Site2 Delta-P (ms) Norm Delta (ms)  Left Median/Ulnar Palm Comparison (Wrist - 8cm)  37C  Median Palm    1.4 <2.2 55.0 Median Palm Ulnar Palm 0.1   Ulnar Palm    1.5 <2.2 20.6      Right Median/Ulnar Palm Comparison (Wrist - 8cm)  37C  Median Palm    1.7 <2.2 33.1 Median Palm Ulnar  Palm 0.3   Ulnar Palm    1.4 <2.2 13.6       EMG   Side Muscle Ins Act Fibs Psw Fasc Number Recrt Dur Dur. Amp Amp. Poly Poly. Comment  Right 1stDorInt Nml Nml Nml Nml Nml Nml Nml Nml Nml Nml Nml Nml N/A  Right Biceps Nml Nml Nml Nml Nml Nml Nml Nml Nml Nml Nml Nml N/A  Right Triceps Nml Nml Nml Nml Nml Nml Nml Nml Nml Nml Nml Nml N/A  Right Deltoid Nml Nml Nml Nml Nml Nml Nml Nml Nml Nml Nml Nml N/A  Right FlexCarpiUln Nml Nml Nml Nml Nml Nml Nml Nml Nml Nml Nml Nml N/A  Right PronatorTeres Nml Nml Nml Nml Nml Nml Nml Nml Nml Nml Nml Nml N/A  Left Triceps Nml Nml Nml Nml Nml Nml Nml Nml Nml Nml Nml Nml N/A  Left 1stDorInt Nml Nml Nml Nml Nml Nml Nml Nml Nml Nml Nml Nml N/A  Left PronatorTeres Nml Nml Nml Nml Nml Nml Nml Nml Nml Nml Nml Nml N/A  Left Biceps Nml Nml Nml Nml Nml Nml Nml Nml Nml Nml Nml Nml N/A  Left FlexCarpiUln Nml Nml Nml Nml Nml Nml Nml Nml Nml Nml Nml Nml N/A  Left Deltoid Nml Nml Nml Nml Nml Nml Nml Nml Nml Nml Nml Nml N/A      Waveforms:

## 2018-11-16 ENCOUNTER — Encounter: Payer: Self-pay | Admitting: Family Medicine

## 2018-11-16 ENCOUNTER — Ambulatory Visit (INDEPENDENT_AMBULATORY_CARE_PROVIDER_SITE_OTHER): Payer: Medicare Other | Admitting: Family Medicine

## 2018-11-16 DIAGNOSIS — G5623 Lesion of ulnar nerve, bilateral upper limbs: Secondary | ICD-10-CM

## 2018-11-16 DIAGNOSIS — F325 Major depressive disorder, single episode, in full remission: Secondary | ICD-10-CM | POA: Diagnosis not present

## 2018-11-16 DIAGNOSIS — M249 Joint derangement, unspecified: Secondary | ICD-10-CM | POA: Diagnosis not present

## 2018-11-16 DIAGNOSIS — G4730 Sleep apnea, unspecified: Secondary | ICD-10-CM | POA: Diagnosis not present

## 2018-11-16 DIAGNOSIS — R5383 Other fatigue: Secondary | ICD-10-CM | POA: Diagnosis not present

## 2018-11-16 NOTE — Assessment & Plan Note (Signed)
Confirmed via NCS/EMG. Continue conservative management. Recommended OTC NSAIDs as needed. Consider referral to sports med if has severe recurrence.

## 2018-11-16 NOTE — Assessment & Plan Note (Signed)
Stable without medications. He will continue seeing his therapist.

## 2018-11-16 NOTE — Assessment & Plan Note (Signed)
Check sleep study to rule out OSA given his history of snoring. Advised patient to come in for CPE soon. Will check blood work at that time including CBC, CMET,  And TSH.

## 2018-11-16 NOTE — Assessment & Plan Note (Signed)
Stable. Could contribute to ulnar neuropathy or may have underlying connective tissue disorder. Recommended referral to sports medicine, however patient declined.

## 2018-11-16 NOTE — Progress Notes (Signed)
    Chief Complaint:  Donald Sweeney is a 39 y.o. male who presents today for a virtual office visit with a chief complaint of ulnar neuropathy follow up.   Assessment/Plan:  Hypermobility of joint Stable. Could contribute to ulnar neuropathy or may have underlying connective tissue disorder. Recommended referral to sports medicine, however patient declined.   Fatigue Check sleep study to rule out OSA given his history of snoring. Advised patient to come in for CPE soon. Will check blood work at that time including CBC, CMET,  And TSH.   Major depression in remission (HCC) Stable without medications. He will continue seeing his therapist.   Cubital tunnel syndrome, bilateral Confirmed via NCS/EMG. Continue conservative management. Recommended OTC NSAIDs as needed. Consider referral to sports med if has severe recurrence.     Subjective:  HPI:  Ulnar Neuropathy Had NCS/EMG with neurology that confirmed bilateral cubital tunnel syndrome. Symptoms have improved over the last couple of months. Still has some symptoms, but they are more manageable.   Joint Hypermobility He still has issues with his "joints popping out of place" in his shoulders, hips, and knees. These symptoms are also stable.  Fatigue He has also noticed increasing fatigue for the past several weeks. HE thinks he gets an ok amount of sleep however will frequently doze off during the day. He has never been tested for sleep apnea, but he does frequently snore.   Depression Not currently on any medications but is following with a therapist. Overall feels like his symptoms are stable and manageable.   ROS: Per HPI  PMH: He reports that he has been smoking cigarettes. He has never used smokeless tobacco. He reports current alcohol use. He reports that he does not use drugs.      Objective/Observations  Physical Exam: Gen: NAD, resting comfortably Pulm: Normal work of breathing Neuro: Grossly normal, moves all  extremities Psych: Normal affect and thought content  Virtual Visit via Video   I connected with Donald Sweeney on 11/16/18 at  9:40 AM EDT by a video enabled telemedicine application and verified that I am speaking with the correct person using two identifiers. I discussed the limitations of evaluation and management by telemedicine and the availability of in person appointments. The patient expressed understanding and agreed to proceed.   Patient location: Home Provider location: [place] [employer] Persons participating in the virtual visit: Myself and Patient     Katina Degree. Jimmey Ralph, MD 11/16/2018 9:47 AM

## 2018-11-17 ENCOUNTER — Telehealth: Payer: Self-pay | Admitting: Neurology

## 2018-11-17 NOTE — Telephone Encounter (Signed)
Due to current COVID 19 pandemic, our office is severely reducing in office visits, in order to minimize the risk to our patients and healthcare providers.    Pt understands that although there may be some limitations with this type of visit, we will take all precautions to reduce any security or privacy concerns.  Pt understands that this will be treated like an in office visit and we will file with pt's insurance, and there may be a patient responsible charge related to this service.  Pt's email is [email or web address]. Pt understands that the nurse will be calling to go over pt's chart.

## 2018-11-18 ENCOUNTER — Encounter: Payer: Self-pay | Admitting: Neurology

## 2018-11-18 NOTE — Telephone Encounter (Signed)

## 2018-11-23 ENCOUNTER — Encounter: Payer: Self-pay | Admitting: Neurology

## 2018-11-23 ENCOUNTER — Other Ambulatory Visit: Payer: Self-pay

## 2018-11-23 ENCOUNTER — Ambulatory Visit (INDEPENDENT_AMBULATORY_CARE_PROVIDER_SITE_OTHER): Payer: Medicare Other | Admitting: Neurology

## 2018-11-23 DIAGNOSIS — R0683 Snoring: Secondary | ICD-10-CM

## 2018-11-23 DIAGNOSIS — Z87898 Personal history of other specified conditions: Secondary | ICD-10-CM | POA: Diagnosis not present

## 2018-11-23 DIAGNOSIS — Z72820 Sleep deprivation: Secondary | ICD-10-CM | POA: Diagnosis not present

## 2018-11-23 DIAGNOSIS — G4761 Periodic limb movement disorder: Secondary | ICD-10-CM

## 2018-11-23 DIAGNOSIS — G478 Other sleep disorders: Secondary | ICD-10-CM | POA: Diagnosis not present

## 2018-11-23 DIAGNOSIS — Z8659 Personal history of other mental and behavioral disorders: Secondary | ICD-10-CM | POA: Insufficient documentation

## 2018-11-23 NOTE — Patient Instructions (Signed)

## 2018-11-23 NOTE — Progress Notes (Signed)
\ Virtual Visit via Video Note  I connected with Donald Sweeney on 11/23/18 at  9:00 AM EDT by a video enabled telemedicine application and verified that I am speaking with the correct person using two identifiers.  Location: Patient: home  Provider: at [facility] office   I discussed the limitations of evaluation and management by telemedicine and the availability of in person appointments. The patient expressed understanding and agreed to proceed.   SLEEP MEDICINE CLINIC   Provider:  Melvyn Novas, M D  Primary Care Physician:  Ardith Dark, MD   Referring Provider: Ardith Dark, MD     HIstory of present illness: Donald Sweeney is a 38 y.o. male patient, seen in a video guided visit upon referral by Dr. Jimmey Ralph for a sleep evaluation:   Chief complaint according to patient : Donald Sweeney is a 38 year old right-handed African-American male patient who reports that his sleep quality has declined over the last years.  He endorsed not a high degree of daytime sleepiness, but more fatigue, anxiety and depression.  Sleep and medical history: The patient has been on disability for 7 years, based on that bipolar diagnosis, he also stated he was injured during [employer] training and therefore was never on active duty, he has never seen combat.  Until 7 years ago he works swing shifts alternating night and day shifts.  He has not been gainfully employed since his disability came through for the last 7 years.  He reports numbness in his lower extremities and sometimes in his hands.  He was just evaluated by neurology at [facility], and diagnosed with a Cubital Tunnel syndrome.  Family medical and sleep history: father with HYTN, but only maternal health history is completely known: Mother had stroke, seizures and HTN, not DM, thyroid disease, no bipolar disorder mentioned.     Social history: The patient is on disability he is married, lives with his wife mother-in-law and 3  children but states that the family currently count 7/2 and not sure which is 7 depression in the household is.  He is about 1/4 to 1/2 pack/day smoker, he drinks caffeine in form of soda and ice tea about 2-3 a day.  As I mentioned he used to be a shift Financial controller.  The youngest of 3 children is 36 years old and once he brings the children to bed he can plan his own bedtime.    Sleep habits are as follows:Dinnertime will be 6 sometimes 8 PM especially for the older children which are 12 and 10, bedtime for him is 11 PM or even later, he describes the bedroom as cool, quiet and dark but is spouse has noted that he snores loudly.  She has not reported that he stops breathing so.  He states he sleeps in any position with 1 or 2 pillows for head support and usually gets 4 to 5 hours of uninterrupted sleep.  He rarely wakes up to go to the bathroom it is usually the youngest child that may wake him early sometimes he wakes up from trouble breathing or choking and pain also wakes him.  He reports a history of leg and back injuries.  On weekdays which is school days 6 AM this is rise time and he estimates a total sleep time of only 5 hours.  He also advised me that he suffered from night terrors between the ages of 64 and 25 but that these were not PTSD related and not military related.  He  wakes with a dry mouth often has a headache but not in the morning.  He reports that his headache arises during the day  on both temples and has a pressure quality.    Review of Systems: Out of a complete 14 system review, the patient complains of only the following symptoms, and all other reviewed systems are negative. Snoring, anxiety  depression  How likely are you to doze in the following situations: 0 = not likely, 1 = slight chance, 2 = moderate chance, 3 = high chance  Sitting and Reading? Watching Television? Sitting inactive in a public place (theater or meeting)? Lying down in the afternoon when circumstances  permit? Sitting and talking to someone? Sitting quietly after lunch without alcohol? In a car, while stopped for a few minutes in traffic? As a passenger in a car for an hour without a break?  Total = 5/ 24  History of night terrors between age [age] .  No PTSD  Bipolar mood changes.    Social History   Socioeconomic History  . Marital status: Married    Spouse name: Not on file  . Number of children: 3  . Years of education: 38  . Highest education level: Associate degree: occupational, Scientist, product/process development, or vocational program  Occupational History  . Occupation: on disability  Social Needs  . Financial resource strain: Not on file  . Food insecurity:    Worry: Not on file    Inability: Not on file  . Transportation needs:    Medical: Not on file    Non-medical: Not on file  Tobacco Use  . Smoking status: Current Every Day Smoker    Packs/day: 0.25    Types: Cigarettes  . Smokeless tobacco: Never Used  Substance and Sexual Activity  . Alcohol use: Yes    Comment: Rarely  . Drug use: No  . Sexual activity: Not on file  Lifestyle  . Physical activity:    Days per week: Not on file    Minutes per session: Not on file  . Stress: Not on file  Relationships  . Social connections:    Talks on phone: Not on file    Gets together: Not on file    Attends religious service: Not on file    Active member of club or organization: Not on file    Attends meetings of clubs or organizations: Not on file    Relationship status: Not on file  . Intimate partner violence:    Fear of current or ex partner: Not on file    Emotionally abused: Not on file    Physically abused: Not on file    Forced sexual activity: Not on file  Other Topics Concern  . Not on file  Social History Narrative   Lives with wife, mother and 3 children in a one story home.  On disability since 2013 for bipolar disease and auditory hallucinations.     He was previously working in Clinical biochemist about 7  years.    Education: associates degree.     Family History  Problem Relation Age of Onset  . Stroke Maternal Grandmother   . Hypertension Maternal Grandmother   . Heart disease Maternal Grandfather   . Lung cancer Paternal Grandfather   . Epilepsy Maternal Uncle   . Hypertension Mother   . Stroke Mother   . Seizures Mother   . Hypertension Father     Past Medical History:  Diagnosis Date  . Frequent headaches   .  GERD (gastroesophageal reflux disease)   . History of fainting spells of unknown cause   . Hyperlipidemia   . Hypertension     No past surgical history on file.  No current outpatient medications on file.   No current facility-administered medications for this visit.     Allergies as of 11/23/2018 - Review Complete 11/18/2018  Allergen Reaction Noted  . Fish allergy  04/14/2018  . Shellfish allergy  04/14/2018    Vitals: There were no vitals taken for this visit. Last Weight:  Wt Readings from Last 1 Encounters:  08/07/18 285 lb 8 oz (129.5 kg)   JYN:WGNFABMI:There is no height or weight on file to calculate BMI.     Last Height:   Ht Readings from Last 1 Encounters:  08/07/18 6' (1.829 m)    Physical exam:  General: The patient is awake, alert and appears not in acute distress.  Head: Normocephalic, atraumatic.  Neck ROM is intact  Mallampati 4  neck circumference:1  Nasal airflow 8",  Retrognathia is seen.  Cardiovascular:  without distended neck veins. Respiratory: Breath holding up to 32 seconds.  Skin:  Without evidence of facial or hand edema, or rash Trunk:  Neurologic exam : The patient is awake and alert, oriented to place and time.    Attention span & concentration ability appears normal.  Speech is fluent,  without dysarthria, dysphonia or aphasia.  Mood and affect are appropriate.  Cranial nerves: Pupils are equal . Extraocular movements  in vertical and horizontal planes intact and without nystagmus.  Facial motor strength is symmetric  and tongue and uvula move midline. Shoulder shrug was symmetrical.   Motor exam:  symmetric ROM and muscle bulk  in upper extremities.  Sensory:  Fine touch, pinprick and vibration were deferred. By description only:  Numbness from the elbow to the lateral 2 fingers of the hand on both sides. Coordination: Rapid alternating movements  normal without evidence of ataxia, dysmetria or tremor.  Gait and station: Patient walks without assistive device.  Assessment and Plan:  WE will ask the patient to set an earlier bedtime, allowing for longer sleep, hopefully this will be more refreshing. I ordered a sleep test to rule out apnea, with his night terror history He would also qualify for an attended sleep study in lab.    Follow Up Instructions: we will meet after either HST or PSG are preformed, I will encourage him to apply the 14 days guide to better sleep.He may have PLMs- given his leg pain, numbness, and presumed neuropathy, review of Dr Roxana Hiresonika Patel's note (cupital tunnel).     I discussed the assessment and treatment plan with the patient. The patient was provided an opportunity to ask questions and all were answered. The patient agreed with the plan and demonstrated an understanding of the instructions.   The patient was advised to call back or seek an in-person evaluation if the symptoms worsen or if the condition fails to improve as anticipated.  I provided 32 minutes of non-face-to-face time during this encounter.   Melvyn Novasarmen Dasja Brase, MD  Melvyn NovasARMEN Karma Ansley, MD 11/23/2018, 8:33 AM  Certified in Neurology by ABPN Certified in Sleep Medicine by [facility]  [facility] [address], [address] [place], [place] [id number]

## 2019-01-01 ENCOUNTER — Ambulatory Visit (INDEPENDENT_AMBULATORY_CARE_PROVIDER_SITE_OTHER): Payer: Medicare Other | Admitting: Neurology

## 2019-01-01 DIAGNOSIS — G4710 Hypersomnia, unspecified: Secondary | ICD-10-CM | POA: Diagnosis not present

## 2019-01-01 DIAGNOSIS — Z8659 Personal history of other mental and behavioral disorders: Secondary | ICD-10-CM

## 2019-01-01 DIAGNOSIS — Z72820 Sleep deprivation: Secondary | ICD-10-CM

## 2019-01-01 DIAGNOSIS — G478 Other sleep disorders: Secondary | ICD-10-CM

## 2019-01-11 NOTE — Procedures (Signed)
PATIENT'S NAME:  Donald, Sweeney DOB:      January 15, 1981      MR#:    [id number]     DATE OF RECORDING: 01/01/2019 REFERRING M.D.:  Dimas Chyle, MD Study Performed:   Baseline Polysomnogram HISTORY:  38 y.o. male patient, seen in a video guided visit upon referral by Dr. Jerline Pain for a sleep evaluation:    Chief complaint according to patient : Donald Sweeney is a 38 year old right-handed African-American male patient who reports that his sleep quality has declined over the last years.  He endorsed not a high degree of daytime sleepiness, but more fatigue, anxiety and depression.   Sleep and medical history: The patient has been on disability for 7 years, based on that bipolar diagnosis, he also stated he was injured during [employer] training and therefore was never on active duty, he has never seen combat. Until 7 years ago he works swing shifts alternating night and day shifts. He has not been gainfully employed since his disability came through for the last 7 years.  He reports numbness and was just evaluated by Neurology at [facility], and diagnosed with a Cubital Tunnel syndrome.   Family medical and sleep history: father with HYTN, but only maternal health history is completely known: Mother had stroke, seizures and HTN, not DM, thyroid disease, no bipolar disorder mentioned.       Social history: The patient is on disability: he is married, lives with his wife mother-in-law and 3 children but states that the family currently counts 7 persons and I am not sure who is 7th person in the household is.  He is about 1/4 to 1/2 pack/day smoker, he drinks caffeine in form of soda and ice tea about 2-3 a day.  As I mentioned he used to be a shift Insurance underwriter.    He also advised me that he suffered from night terrors between the ages of 23 and 55 but that these were not PTSD related and not military related.  He wakes with a dry mouth often has a headache but not in the morning.  He reports that his headache  arises during the day on both temples and has a pressure quality.  The patient endorsed the Epworth Sleepiness Scale at 5 points.   The patient's weight 284 pounds with a height of 72 (inches), resulting in a BMI of 38.5 kg/m2. The patient's neck circumference measured 18 inches.  CURRENT MEDICATIONS: None.   PROCEDURE:  This is a multichannel digital polysomnogram utilizing the Somnostar 11.2 system.  Electrodes and sensors were applied and monitored per AASM Specifications.   EEG, EOG, Chin and Limb EMG, were sampled at 200 Hz.  ECG, Snore and Nasal Pressure, Thermal Airflow, Respiratory Effort, CPAP Flow and Pressure, Oximetry was sampled at 50 Hz. Digital video and audio were recorded.      BASELINE STUDY  Lights Out was at 22:10 and Lights On at 05:00.  Total recording time (TRT) was 410.5 minutes, with a total sleep time (TST) of 344.5 minutes.   The patient's sleep latency was 15 minutes.  REM latency was 81 minutes.  The sleep efficiency was 83.9 %.     SLEEP ARCHITECTURE: WASO (Wake after sleep onset) was 59 minutes.  There were 66.5 minutes in Stage N1, 148.5 minutes Stage N2, 46 minutes Stage N3 and 83.5 minutes in Stage REM.  The percentage of Stage N1 was 19.3%, Stage N2 was 43.1%, Stage N3 was 13.4% and Stage R (REM sleep) was 24.2%.  RESPIRATORY ANALYSIS:  There were a total of 12 respiratory events:  5 obstructive apneas, 0 central apneas and 0 mixed apneas with a total of 5 apneas and an apnea index (AI) of .9 /hour. There were 7 hypopneas with a hypopnea index of 1.2 /hour. The patient also had 0 respiratory event related arousals (RERAs).    The total APNEA/HYPOPNEA INDEX (AHI) was 2.1 /hour.  12 events occurred in REM sleep and 0 events in NREM. The REM AHI was 8.6 /hour, versus a non-REM AHI of 0. The patient spent 194 minutes of total sleep time in the supine position and 151 minutes in non-supine. The supine AHI was 2.8/h versus a non-supine AHI of 1.2.  OXYGEN SATURATION &  C02:  The Wake baseline 02 saturation was 95%, with the lowest being 86%. Time spent below 89% saturation equaled 1 minutes.  The arousals were noted as: 104 were spontaneous, 0 were associated with PLMs, and 0 were associated with respiratory events. The patient had a total of 0 Periodic Limb Movements.    Audio and video analysis did show loud snoring, especially during REM sleep but not any abnormal or unusual movements, behaviors, phonations or vocalizations.   EKG was in keeping with normal sinus rhythm (NSR). Post-study, the patient indicated that sleep was better than usual.   IMPRESSION: 1. 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  2. Many spontaneous arousals for which no physiological reason could be identified.   RECOMMENDATIONS:  1. Advise weight loss and dental device for snoring treatment. No follow up with the sleep clinic is needed.   I certify that I have reviewed the entire raw data recording prior to the issuance of this report in accordance with the Standards of Accreditation of the American Academy of Sleep Medicine (AASM)   Melvyn Novasarmen Siedah Sedor, MD Diplomat, American Board of Psychiatry and Neurology  Diplomat, American Board of Sleep Medicine Wellsite geologistMedical Director, [facility]

## 2019-01-12 ENCOUNTER — Telehealth: Payer: Self-pay | Admitting: Neurology

## 2019-01-12 NOTE — Telephone Encounter (Signed)
Called the patient and was able to review the sleep study with him. Advised that primarily snoring was noted and some arousals noted but that they were not related to a organic sleep disorder. Pt verbalized understanding. Pt had no questions at this time but was encouraged to call back if questions arise. Patient requested a copy be mailed to him reviewed the address on file and it was confirmed.

## 2019-01-12 NOTE — Telephone Encounter (Signed)
-----   Message from Larey Seat, MD sent at 01/11/2019  4:35 PM EDT ----- IMPRESSION:  1. 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  2. Many spontaneous arousals for which no physiological reason  could be identified.   RECOMMENDATIONS:   1. Advise weight loss and dental device for snoring treatment. No  follow up with the sleep clinic is needed.  PS : Spontaneous arousals can be related to anxiety, to pain and to caffeine intake.

## 2019-01-12 NOTE — Telephone Encounter (Signed)
Called patient to discuss sleep study results. No answer at this time. LVM for the patient to call back.   

## 2019-05-26 ENCOUNTER — Telehealth: Payer: Self-pay | Admitting: Family Medicine

## 2019-05-26 NOTE — Telephone Encounter (Signed)
I left a message asking the patient to call and schedule Medicare AWV (initial) with Loma Sousa ([place]) and CPE with Dr. Jerline Pain.  If patient calls back, please schedule both at next available opening.  VDM ([place])

## 2019-10-16 ENCOUNTER — Ambulatory Visit: Payer: Medicare Other | Attending: Internal Medicine

## 2019-10-16 DIAGNOSIS — Z23 Encounter for immunization: Secondary | ICD-10-CM

## 2019-10-16 NOTE — Progress Notes (Signed)
   Covid-19 Vaccination Clinic  Name:  Donald Sweeney    MRN: [id number] DOB: 08/29/1980  10/16/2019  [name] was observed post Covid-19 immunization for 30 minutes based on pre-vaccination screening without incident. He was provided with Vaccine Information Sheet and instruction to access the V-Safe system.   [name] was instructed to call 911 with any severe reactions post vaccine: Marland Kitchen Difficulty breathing  . Swelling of face and throat  . A fast heartbeat  . A bad rash all over body  . Dizziness and weakness   Immunizations Administered    Name Date Dose VIS Date Route   Pfizer COVID-19 Vaccine 10/16/2019  2:50 PM 0.3 mL 06/25/2019 Intramuscular   Manufacturer: [employer], [employer]   Lot: [id number]   NDC: [address]-5

## 2019-10-21 ENCOUNTER — Other Ambulatory Visit: Payer: Self-pay

## 2019-10-21 ENCOUNTER — Ambulatory Visit (INDEPENDENT_AMBULATORY_CARE_PROVIDER_SITE_OTHER): Payer: Medicare Other

## 2019-10-21 ENCOUNTER — Ambulatory Visit (INDEPENDENT_AMBULATORY_CARE_PROVIDER_SITE_OTHER): Payer: Medicare Other | Admitting: Family Medicine

## 2019-10-21 ENCOUNTER — Encounter: Payer: Self-pay | Admitting: Family Medicine

## 2019-10-21 VITALS — BP 122/64 | HR 80 | Temp 98.0°F | Ht 72.0 in | Wt 260.6 lb

## 2019-10-21 DIAGNOSIS — Z79899 Other long term (current) drug therapy: Secondary | ICD-10-CM | POA: Diagnosis not present

## 2019-10-21 DIAGNOSIS — R5383 Other fatigue: Secondary | ICD-10-CM | POA: Diagnosis not present

## 2019-10-21 DIAGNOSIS — Z0001 Encounter for general adult medical examination with abnormal findings: Secondary | ICD-10-CM

## 2019-10-21 DIAGNOSIS — Z13220 Encounter for screening for lipoid disorders: Secondary | ICD-10-CM

## 2019-10-21 DIAGNOSIS — Z0000 Encounter for general adult medical examination without abnormal findings: Secondary | ICD-10-CM | POA: Diagnosis not present

## 2019-10-21 DIAGNOSIS — E669 Obesity, unspecified: Secondary | ICD-10-CM | POA: Diagnosis not present

## 2019-10-21 DIAGNOSIS — M549 Dorsalgia, unspecified: Secondary | ICD-10-CM

## 2019-10-21 DIAGNOSIS — F325 Major depressive disorder, single episode, in full remission: Secondary | ICD-10-CM | POA: Diagnosis not present

## 2019-10-21 NOTE — Assessment & Plan Note (Signed)
Will place referral per request of psychiatry.  Symptoms are overall stable currently.

## 2019-10-21 NOTE — Assessment & Plan Note (Signed)
Check labs including CBC, C met, TSH, lipid panel, and B12.  Depression likely a large contributor.  Will hopefully have some improvement as we treat his depression.

## 2019-10-21 NOTE — Patient Instructions (Signed)
It was very nice to see you today!  We will place referral for you to see psychiatry and sports medicine.  We will check blood work today.  Please keep up the great work.  Come back in 1 year for your next physical, or sooner if needed.  Take care, Dr Jerline Pain  Please try these tips to maintain a healthy lifestyle:   Eat at least 3 REAL meals and 1-2 snacks per day.  Aim for no more than 5 hours between eating.  If you eat breakfast, please do so within one hour of getting up.    Each meal should contain half fruits/vegetables, one quarter protein, and one quarter carbs (no bigger than a computer mouse)   Cut down on sweet beverages. This includes juice, soda, and sweet tea.     Drink at least 1 glass of water with each meal and aim for at least 8 glasses per day   Exercise at least 150 minutes every week.    Preventive Care 38-32 Years Old, Male Preventive care refers to lifestyle choices and visits with your health care provider that can promote health and wellness. This includes:  A yearly physical exam. This is also called an annual well check.  Regular dental and eye exams.  Immunizations.  Screening for certain conditions.  Healthy lifestyle choices, such as eating a healthy diet, getting regular exercise, not using drugs or products that contain nicotine and tobacco, and limiting alcohol use. What can I expect for my preventive care visit? Physical exam Your health care provider will check:  Height and weight. These may be used to calculate body mass index (BMI), which is a measurement that tells if you are at a healthy weight.  Heart rate and blood pressure.  Your skin for abnormal spots. Counseling Your health care provider may ask you questions about:  Alcohol, tobacco, and drug use.  Emotional well-being.  Home and relationship well-being.  Sexual activity.  Eating habits.  Work and work Statistician. What immunizations do I  need?  Influenza (flu) vaccine  This is recommended every year. Tetanus, diphtheria, and pertussis (Tdap) vaccine  You may need a Td booster every 10 years. Varicella (chickenpox) vaccine  You may need this vaccine if you have not already been vaccinated. Human papillomavirus (HPV) vaccine  If recommended by your health care provider, you may need three doses over 6 months. Measles, mumps, and rubella (MMR) vaccine  You may need at least one dose of MMR. You may also need a second dose. Meningococcal conjugate (MenACWY) vaccine  One dose is recommended if you are 56-59 years old and a Market researcher living in a residence hall, or if you have one of several medical conditions. You may also need additional booster doses. Pneumococcal conjugate (PCV13) vaccine  You may need this if you have certain conditions and were not previously vaccinated. Pneumococcal polysaccharide (PPSV23) vaccine  You may need one or two doses if you smoke cigarettes or if you have certain conditions. Hepatitis A vaccine  You may need this if you have certain conditions or if you travel or work in places where you may be exposed to hepatitis A. Hepatitis B vaccine  You may need this if you have certain conditions or if you travel or work in places where you may be exposed to hepatitis B. Haemophilus influenzae type b (Hib) vaccine  You may need this if you have certain risk factors. You may receive vaccines as individual doses or as  more than one vaccine together in one shot (combination vaccines). Talk with your health care provider about the risks and benefits of combination vaccines. What tests do I need? Blood tests  Lipid and cholesterol levels. These may be checked every 5 years starting at age 94.  Hepatitis C test.  Hepatitis B test. Screening   Diabetes screening. This is done by checking your blood sugar (glucose) after you have not eaten for a while (fasting).  Sexually  transmitted disease (STD) testing. Talk with your health care provider about your test results, treatment options, and if necessary, the need for more tests. Follow these instructions at home: Eating and drinking   Eat a diet that includes fresh fruits and vegetables, whole grains, lean protein, and low-fat dairy products.  Take vitamin and mineral supplements as recommended by your health care provider.  Do not drink alcohol if your health care provider tells you not to drink.  If you drink alcohol: ? Limit how much you have to 0-2 drinks a day. ? Be aware of how much alcohol is in your drink. In the [place] one drink equals one 12 oz bottle of beer (355 mL), one 5 oz glass of wine (148 mL), or one 1 oz glass of hard liquor (44 mL). Lifestyle  Take daily care of your teeth and gums.  Stay active. Exercise for at least 30 minutes on 5 or more days each week.  Do not use any products that contain nicotine or tobacco, such as cigarettes, e-cigarettes, and chewing tobacco. If you need help quitting, ask your health care provider.  If you are sexually active, practice safe sex. Use a condom or other form of protection to prevent STIs (sexually transmitted infections). What's next?  Go to your health care provider once a year for a well check visit.  Ask your health care provider how often you should have your eyes and teeth checked.  Stay up to date on all vaccines. This information is not intended to replace advice given to you by your health care provider. Make sure you discuss any questions you have with your health care provider. Document Revised: 06/25/2018 Document Reviewed: 06/25/2018 Elsevier Patient Education  2020 [employer].

## 2019-10-21 NOTE — Progress Notes (Signed)
Subjective:   Donald Sweeney is a 39 y.o. male who presents for an Initial Medicare Annual Wellness Visit.  Review of Systems   Cardiac Risk Factors include: male gender;smoking/ tobacco exposure   Objective:    Today's Vitals   10/21/19 1437  BP: 122/64  Pulse: 80  Temp: 98 F (36.7 C)  TempSrc: Temporal  SpO2: 98%  Weight: 260 lb 9.6 oz (118.2 kg)  Height: 6' (1.829 m)   Body mass index is 35.34 kg/m.  Advanced Directives 10/21/2019 12/04/2016  Does Patient Have a Medical Advance Directive? No No  Would patient like information on creating a medical advance directive? Yes (MAU/Ambulatory/Procedural Areas - Information given) -    Current Medications (verified) No outpatient encounter medications on file as of 10/21/2019.   No facility-administered encounter medications on file as of 10/21/2019.    Allergies (verified) Fish allergy and Shellfish allergy   History: Past Medical History:  Diagnosis Date  . Frequent headaches   . GERD (gastroesophageal reflux disease)   . History of fainting spells of unknown cause   . Hyperlipidemia   . Hypertension    History reviewed. No pertinent surgical history. Family History  Problem Relation Age of Onset  . Stroke Maternal Grandmother   . Hypertension Maternal Grandmother   . Heart disease Maternal Grandfather   . Lung cancer Paternal Grandfather   . Epilepsy Maternal Uncle   . Hypertension Mother   . Stroke Mother   . Seizures Mother   . Hypertension Father    Social History   Socioeconomic History  . Marital status: Married    Spouse name: Not on file  . Number of children: 3  . Years of education: 44  . Highest education level: Associate degree: occupational, Scientist, product/process development, or vocational program  Occupational History  . Occupation: on disability  Tobacco Use  . Smoking status: Current Every Day Smoker    Packs/day: 0.25    Types: Cigarettes  . Smokeless tobacco: Never Used  Substance and Sexual Activity   . Alcohol use: Yes    Comment: Rarely  . Drug use: No  . Sexual activity: Not on file  Other Topics Concern  . Not on file  Social History Narrative   Lives with wife, mother and 3 children in a one story home.  On disability since 2013 for bipolar disease and auditory hallucinations.     He was previously working in Clinical biochemist about 7 years.    Education: associates degree.    Social Determinants of Health   Financial Resource Strain:   . Difficulty of Paying Living Expenses:   Food Insecurity:   . Worried About Programme researcher, broadcasting/film/video in the Last Year:   . Barista in the Last Year:   Transportation Needs:   . Freight forwarder (Medical):   Marland Kitchen Lack of Transportation (Non-Medical):   Physical Activity:   . Days of Exercise per Week:   . Minutes of Exercise per Session:   Stress:   . Feeling of Stress :   Social Connections:   . Frequency of Communication with Friends and Family:   . Frequency of Social Gatherings with Friends and Family:   . Attends Religious Services:   . Active Member of Clubs or Organizations:   . Attends Banker Meetings:   Marland Kitchen Marital Status:    Tobacco Counseling Ready to quit: Not Answered Counseling given: Not Answered   Clinical Intake:  Pre-visit preparation completed: Yes  Pain : No/denies pain  Diabetes: No  How often do you need to have someone help you when you read instructions, pamphlets, or other written materials from your doctor or pharmacy?: 1 - Never  Interpreter Needed?: No  Information entered by :: Kandis Fantasia LPN  Activities of Daily Living In your present state of health, do you have any difficulty performing the following activities: 10/21/2019  Hearing? N  Vision? N  Difficulty concentrating or making decisions? N  Walking or climbing stairs? N  Dressing or bathing? N  Doing errands, shopping? N  Preparing Food and eating ? N  Using the Toilet? N  In the past six months, have you  accidently leaked urine? N  Do you have problems with loss of bowel control? N  Managing your Medications? N  Managing your Finances? N  Housekeeping or managing your Housekeeping? N  Some recent data might be hidden     Immunizations and Health Maintenance Immunization History  Administered Date(s) Administered  . Influenza,inj,Quad PF,6+ Mos 04/14/2018  . PFIZER SARS-COV-2 Vaccination 10/16/2019   Health Maintenance Due  Topic Date Due  . HIV Screening  Never done  . TETANUS/TDAP  Never done    Patient Care Team: Ardith Dark, MD as PCP - General (Family Medicine)  Indicate any recent Medical Services you may have received from other than [facility] providers in the past year (date may be approximate).    Assessment:   This is a routine wellness examination for [place].  Hearing/Vision screen No exam data present  Dietary issues and exercise activities discussed: Current Exercise Habits: The patient does not participate in regular exercise at present  Goals   None    Depression Screen PHQ 2/9 Scores 10/21/2019 04/14/2018  PHQ - 2 Score 5 0  PHQ- 9 Score 24 -    Fall Risk Fall Risk  10/21/2019 08/07/2018 04/14/2018  Falls in the past year? 0 1 No  Number falls in past yr: 0 1 -  Injury with Fall? 0 0 -  Risk for fall due to : No Fall Risks Other (Comment) -  Follow up Falls evaluation completed;Education provided;Falls prevention discussed Falls evaluation completed;Education provided;Falls prevention discussed -    Is the patient's home free of loose throw rugs in walkways, pet beds, electrical cords, etc?   yes      Grab bars in the bathroom? yes      Handrails on the stairs?   yes      Adequate lighting?   yes  Timed Get Up and Go performed: completed and within normal timeframe; no gait abnormalities noted   Cognitive Function: no cognitive conerns at this time    6CIT Screen 10/21/2019  What Year? 0 points  What month? 0 points  What time? 0 points    Count back from 20 0 points  Months in reverse 0 points  Repeat phrase 0 points  Total Score 0    Screening Tests Health Maintenance  Topic Date Due  . HIV Screening  Never done  . TETANUS/TDAP  Never done  . INFLUENZA VACCINE  02/13/2020    Qualifies for Shingles Vaccine?  Not eligible   Cancer Screenings: Lung: Low Dose CT Chest recommended if Age 49-80 years, 30 pack-year currently smoking OR have quit w/in 15years. Patient does not qualify. Colorectal: Not indicated      Plan:  I have personally reviewed and addressed the Medicare Annual Wellness questionnaire and have noted the following in the patient's  chart:  A. Medical and social history B. Use of alcohol, tobacco or illicit drugs  C. Current medications and supplements D. Functional ability and status E.  Nutritional status F.  Physical activity G. Advance directives H. List of other physicians I.  Hospitalizations, surgeries, and ER visits in previous 12 months J.  [place] such as hearing and vision if needed, cognitive and depression L. Referrals, records requested, and appointments- none   In addition, I have reviewed and discussed with patient certain preventive protocols, quality metrics, and best practice recommendations. A written personalized care plan for preventive services as well as general preventive health recommendations were provided to patient.   Signed,  Denman George, LPN  Nurse Health Advisor   Nurse Notes: Will discuss with provider options with medications and or referral

## 2019-10-21 NOTE — Progress Notes (Signed)
Chief Complaint:  Donald Sweeney is a 39 y.o. male who presents today for his annual comprehensive physical exam.    Assessment/Plan:  New/Acute Problems: Back pain No red flags.  Will place referral to sports medicine.  Can use over-the-counter meds if needed.  Chronic Problems Addressed Today: Major depression in remission [facility]) Will place referral per request of psychiatry.  Symptoms are overall stable currently.  Fatigue Check labs including CBC, C met, TSH, lipid panel, and B12.  Depression likely a large contributor.  Will hopefully have some improvement as we treat his depression.  Body mass index is 35.34 kg/m. / Obese BMI Metric Follow Up - 10/21/19 1526      BMI Metric Follow Up-Please document annually   BMI Metric Follow Up  Education provided       Preventative Healthcare: Check CBC, C met, TSH, lipid panel, B12.  Patient Counseling(The following topics were reviewed and/or handout was given):  -Nutrition: Stressed importance of moderation in sodium/caffeine intake, saturated fat and cholesterol, caloric balance, sufficient intake of fresh fruits, vegetables, and fiber.  -Stressed the importance of regular exercise.   -Substance Abuse: Discussed cessation/primary prevention of tobacco, alcohol, or other drug use; driving or other dangerous activities under the influence; availability of treatment for abuse.   -Injury prevention: Discussed safety belts, safety helmets, smoke detector, smoking near bedding or upholstery.   -Sexuality: Discussed sexually transmitted diseases, partner selection, use of condoms, avoidance of unintended pregnancy and contraceptive alternatives.   -Dental health: Discussed importance of regular tooth brushing, flossing, and dental visits.  -Health maintenance and immunizations reviewed. Please refer to Health maintenance section.  Return to care in 1 year for next preventative visit.     Subjective:  HPI:  He has no acute  complaints today.   He has had chronic mid back pain for several years.  Worse with certain motions.  Usually occurs randomly.  Symptoms come and go.  He has never had an x-ray done however his previous doctors have always told him that he just needed to lose weight.  Symptoms are currently manageable.  Would also like to be referred to see psychiatry today.  He has not been on any medications.  He was previously on several medications including Prozac, Wellbutrin, carbamazepine, Adderall, and quetiapine.  He would like to get restarted on these medications as it is possible.  Lifestyle Diet: Has been cutting down on meat and dairy.  Exercise: Limited  Depression screen [facility] 2/9 10/21/2019  Decreased Interest 3  Down, Depressed, Hopeless 2  PHQ - 2 Score 5  Altered sleeping 3  Tired, decreased energy 3  Change in appetite 3  Feeling bad or failure about yourself  1  Trouble concentrating 3  Moving slowly or fidgety/restless 3  Suicidal thoughts 3  PHQ-9 Score 24  Difficult doing work/chores Very difficult    Health Maintenance Due  Topic Date Due  . HIV Screening  Never done  . TETANUS/TDAP  Never done     ROS: Per HPI, otherwise a complete review of systems was negative.   PMH:  The following were reviewed and entered/updated in epic: Past Medical History:  Diagnosis Date  . Frequent headaches   . GERD (gastroesophageal reflux disease)   . History of fainting spells of unknown cause   . Hyperlipidemia   . Hypertension    Patient Active Problem List   Diagnosis Date Noted  . Loud snoring 11/23/2018  . History of night terrors 11/23/2018  .  Hypermobility of joint 11/16/2018  . Fatigue 11/16/2018  . Major depression in remission ([place]) 11/16/2018  . Cubital tunnel syndrome, bilateral 04/14/2018   No past surgical history on file.  Family History  Problem Relation Age of Onset  . Stroke Maternal Grandmother   . Hypertension Maternal Grandmother   . Heart disease  Maternal Grandfather   . Lung cancer Paternal Grandfather   . Epilepsy Maternal Uncle   . Hypertension Mother   . Stroke Mother   . Seizures Mother   . Hypertension Father     Medications- reviewed and updated No current outpatient medications on file.   No current facility-administered medications for this visit.    Allergies-reviewed and updated Allergies  Allergen Reactions  . Fish Allergy   . Shellfish Allergy     Social History   Socioeconomic History  . Marital status: Married    Spouse name: Not on file  . Number of children: 3  . Years of education: 81  . Highest education level: Associate degree: occupational, Hotel manager, or vocational program  Occupational History  . Occupation: on disability  Tobacco Use  . Smoking status: Current Every Day Smoker    Packs/day: 0.25    Types: Cigarettes  . Smokeless tobacco: Never Used  Substance and Sexual Activity  . Alcohol use: Yes    Comment: Rarely  . Drug use: No  . Sexual activity: Not on file  Other Topics Concern  . Not on file  Social History Narrative   Lives with wife, mother and 3 children in a one story home.  On disability since 2013 for bipolar disease and auditory hallucinations.     He was previously working in Therapist, art about 7 years.    Education: associates degree.    Social Determinants of Health   Financial Resource Strain:   . Difficulty of Paying Living Expenses:   Food Insecurity:   . Worried About Charity fundraiser in the Last Year:   . Arboriculturist in the Last Year:   Transportation Needs:   . Film/video editor (Medical):   Marland Kitchen Lack of Transportation (Non-Medical):   Physical Activity:   . Days of Exercise per Week:   . Minutes of Exercise per Session:   Stress:   . Feeling of Stress :   Social Connections:   . Frequency of Communication with Friends and Family:   . Frequency of Social Gatherings with Friends and Family:   . Attends Religious Services:   . Active  Member of Clubs or Organizations:   . Attends Archivist Meetings:   Marland Kitchen Marital Status:         Objective:  Physical Exam: BP 122/64   Pulse 80   Temp 98 F (36.7 C) (Temporal)   Ht 6' (1.829 m)   Wt 260 lb 9.6 oz (118.2 kg)   SpO2 98%   BMI 35.34 kg/m   Body mass index is 35.34 kg/m. Wt Readings from Last 3 Encounters:  10/21/19 260 lb 9.6 oz (118.2 kg)  10/21/19 260 lb 9.6 oz (118.2 kg)  08/07/18 285 lb 8 oz (129.5 kg)   Gen: NAD, resting comfortably HEENT: TMs normal bilaterally. OP clear. No thyromegaly noted.  CV: RRR with no murmurs appreciated Pulm: NWOB, CTAB with no crackles, wheezes, or rhonchi GI: Normal bowel sounds present. Soft, Nontender, Nondistended. MSK: no edema, cyanosis, or clubbing noted Skin: warm, dry Neuro: CN2-12 grossly intact. Strength 5/5 in upper and lower extremities. Reflexes  symmetric and intact bilaterally.  Psych: Normal affect and thought content     Amiel Mccaffrey M. Jerline Pain, MD 10/21/2019 3:27 PM

## 2019-10-21 NOTE — Patient Instructions (Signed)
[name] , Thank you for taking time to come for your Medicare Wellness Visit. I appreciate your ongoing commitment to your health goals. Please review the following plan we discussed and let me know if I can assist you in the future.   Screening recommendations/referrals: Colorectal Screening: Starting at age 38   Vision and Dental Exams: Recommended annual ophthalmology exams for early detection of glaucoma and other disorders of the eye Recommended annual dental exams for proper oral hygiene  Vaccinations: Influenza vaccine: recommended each fall  Pneumococcal vaccine: starting at age 87 Tdap vaccine: recommended every 10 years; Please call your insurance company to determine your out of pocket expense. You also receive this vaccine at your local pharmacy or Health Dept.  Advanced directives: I have provided a copy for you to complete at home and have notarized. Once this is complete please bring a copy in to our office so we can scan it into your chart.  Goals: Recommend to drink at least 6-8 8oz glasses of water per day and consume a balanced diet rich in fresh fruits and vegetables.   Next appointment: Please schedule your Annual Wellness Visit with your Nurse Health Advisor in one year.  Preventive Care 40-64 Years, Male Preventive care refers to lifestyle choices and visits with your health care provider that can promote health and wellness. What does preventive care include?  A yearly physical exam. This is also called an annual well check.  Dental exams once or twice a year.  Routine eye exams. Ask your health care provider how often you should have your eyes checked.  Personal lifestyle choices, including:  Daily care of your teeth and gums.  Regular physical activity.  Eating a healthy diet.  Avoiding tobacco and drug use.  Limiting alcohol use.  Practicing safe sex.  Taking low-dose aspirin every day starting at age 80 if recommended by your health care  provider.  Taking vitamin and mineral supplements as recommended by your health care provider. What happens during an annual well check? The services and screenings done by your health care provider during your annual well check will depend on your age, overall health, lifestyle risk factors, and family history of disease. Counseling  Your health care provider may ask you questions about your:  Alcohol use.  Tobacco use.  Drug use.  Emotional well-being.  Home and relationship well-being.  Sexual activity.  Eating habits.  Work and work Astronomer. Screening  You may have the following tests or measurements:  Height, weight, and BMI.  Blood pressure.  Lipid and cholesterol levels. These may be checked every 5 years, or more frequently if you are over 52 years old.  Skin check.  Lung cancer screening. You may have this screening every year starting at age 56 if you have a 30-pack-year history of smoking and currently smoke or have quit within the past 15 years.  Fecal occult blood test (FOBT) of the stool. You may have this test every year starting at age 51.  Flexible sigmoidoscopy or colonoscopy. You may have a sigmoidoscopy every 5 years or a colonoscopy every 10 years starting at age 38.  Prostate cancer screening. Recommendations will vary depending on your family history and other risks.  Hepatitis C blood test.  Hepatitis B blood test.  Sexually transmitted disease (STD) testing.  Diabetes screening. This is done by checking your blood sugar (glucose) after you have not eaten for a while (fasting). You may have this done every 1-3 years. Discuss your  test results, treatment options, and if necessary, the need for more tests with your health care provider. Vaccines  Your health care provider may recommend certain vaccines, such as:  Influenza vaccine. This is recommended every year.  Tetanus, diphtheria, and acellular pertussis (Tdap, Td) vaccine. You may  need a Td booster every 10 years.  Zoster vaccine. You may need this after age 73.  Pneumococcal 13-valent conjugate (PCV13) vaccine. You may need this if you have certain conditions and have not been vaccinated.  Pneumococcal polysaccharide (PPSV23) vaccine. You may need one or two doses if you smoke cigarettes or if you have certain conditions. Talk to your health care provider about which screenings and vaccines you need and how often you need them. This information is not intended to replace advice given to you by your health care provider. Make sure you discuss any questions you have with your health care provider. Document Released: 07/28/2015 Document Revised: 03/20/2016 Document Reviewed: 05/02/2015 Elsevier Interactive Patient Education  2017 [place] Prevention in the Home Falls can cause injuries. They can happen to people of all ages. There are many things you can do to make your home safe and to help prevent falls. What can I do on the outside of my home?  Regularly fix the edges of walkways and driveways and fix any cracks.  Remove anything that might make you trip as you walk through a door, such as a raised step or threshold.  Trim any bushes or trees on the path to your home.  Use bright outdoor lighting.  Clear any walking paths of anything that might make someone trip, such as rocks or tools.  Regularly check to see if handrails are loose or broken. Make sure that both sides of any steps have handrails.  Any raised decks and porches should have guardrails on the edges.  Have any leaves, snow, or ice cleared regularly.  Use sand or salt on walking paths during winter.  Clean up any spills in your garage right away. This includes oil or grease spills. What can I do in the bathroom?  Use night lights.  Install grab bars by the toilet and in the tub and shower. Do not use towel bars as grab bars.  Use non-skid mats or decals in the tub or  shower.  If you need to sit down in the shower, use a plastic, non-slip stool.  Keep the floor dry. Clean up any water that spills on the floor as soon as it happens.  Remove soap buildup in the tub or shower regularly.  Attach bath mats securely with double-sided non-slip rug tape.  Do not have throw rugs and other things on the floor that can make you trip. What can I do in the bedroom?  Use night lights.  Make sure that you have a light by your bed that is easy to reach.  Do not use any sheets or blankets that are too big for your bed. They should not hang down onto the floor.  Have a firm chair that has side arms. You can use this for support while you get dressed.  Do not have throw rugs and other things on the floor that can make you trip. What can I do in the kitchen?  Clean up any spills right away.  Avoid walking on wet floors.  Keep items that you use a lot in easy-to-reach places.  If you need to reach something above you, use a strong step stool  that has a grab bar.  Keep electrical cords out of the way.  Do not use floor polish or wax that makes floors slippery. If you must use wax, use non-skid floor wax.  Do not have throw rugs and other things on the floor that can make you trip. What can I do with my stairs?  Do not leave any items on the stairs.  Make sure that there are handrails on both sides of the stairs and use them. Fix handrails that are broken or loose. Make sure that handrails are as long as the stairways.  Check any carpeting to make sure that it is firmly attached to the stairs. Fix any carpet that is loose or worn.  Avoid having throw rugs at the top or bottom of the stairs. If you do have throw rugs, attach them to the floor with carpet tape.  Make sure that you have a light switch at the top of the stairs and the bottom of the stairs. If you do not have them, ask someone to add them for you. What else can I do to help prevent  falls?  Wear shoes that:  Do not have high heels.  Have rubber bottoms.  Are comfortable and fit you well.  Are closed at the toe. Do not wear sandals.  If you use a stepladder:  Make sure that it is fully opened. Do not climb a closed stepladder.  Make sure that both sides of the stepladder are locked into place.  Ask someone to hold it for you, if possible.  Clearly mark and make sure that you can see:  Any grab bars or handrails.  First and last steps.  Where the edge of each step is.  Use tools that help you move around (mobility aids) if they are needed. These include:  Canes.  Walkers.  Scooters.  Crutches.  Turn on the lights when you go into a dark area. Replace any light bulbs as soon as they burn out.  Set up your furniture so you have a clear path. Avoid moving your furniture around.  If any of your floors are uneven, fix them.  If there are any pets around you, be aware of where they are.  Review your medicines with your doctor. Some medicines can make you feel dizzy. This can increase your chance of falling. Ask your doctor what other things that you can do to help prevent falls. This information is not intended to replace advice given to you by your health care provider. Make sure you discuss any questions you have with your health care provider. Document Released: 04/27/2009 Document Revised: 12/07/2015 Document Reviewed: 08/05/2014 Elsevier Interactive Patient Education  2017 [employer].

## 2019-10-22 LAB — COMPREHENSIVE METABOLIC PANEL
ALT: 37 U/L (ref 0–53)
AST: 21 U/L (ref 0–37)
Albumin: 4.4 g/dL (ref 3.5–5.2)
Alkaline Phosphatase: 93 U/L (ref 39–117)
BUN: 8 mg/dL (ref 6–23)
CO2: 28 mEq/L (ref 19–32)
Calcium: 9.5 mg/dL (ref 8.4–10.5)
Chloride: 106 mEq/L (ref 96–112)
Creatinine, Ser: 1.26 mg/dL (ref 0.40–1.50)
GFR: 77.39 mL/min (ref >60.00)
Glucose, Bld: 92 mg/dL (ref 70–99)
Potassium: 4.5 mEq/L (ref 3.5–5.1)
Sodium: 142 mEq/L (ref 135–145)
Total Bilirubin: 0.5 mg/dL (ref 0.2–1.2)
Total Protein: 6.7 g/dL (ref 6.0–8.3)

## 2019-10-22 LAB — TSH: TSH: 1.7 u[IU]/mL (ref 0.35–4.50)

## 2019-10-22 LAB — LIPID PANEL
Cholesterol: 152 mg/dL (ref 0–200)
HDL: 39.2 mg/dL (ref >39.00)
LDL Cholesterol: 93 mg/dL (ref 0–99)
NonHDL: 112.72
Total CHOL/HDL Ratio: 4
Triglycerides: 101 mg/dL (ref 0.0–149.0)
VLDL: 20.2 mg/dL (ref 0.0–40.0)

## 2019-10-22 LAB — VITAMIN B12: Vitamin B-12: 320 pg/mL (ref 211–911)

## 2019-10-22 LAB — CBC
HCT: 48.6 % (ref 39.0–52.0)
Hemoglobin: 16.1 g/dL (ref 13.0–17.0)
MCHC: 33.1 g/dL (ref 30.0–36.0)
MCV: 90.1 fl (ref 78.0–100.0)
Platelets: 180 10*3/uL (ref 150.0–400.0)
RBC: 5.4 Mil/uL (ref 4.22–5.81)
RDW: 13.4 % (ref 11.5–15.5)
WBC: 5.2 10*3/uL (ref 4.0–10.5)

## 2019-10-22 NOTE — Progress Notes (Signed)
Please inform patient of the following:  Blood work is all NORMAL.  Katina Degree. Jimmey Ralph, MD 10/22/2019 1:01 PM

## 2019-10-25 ENCOUNTER — Telehealth: Payer: Self-pay | Admitting: Family Medicine

## 2019-10-25 NOTE — Telephone Encounter (Signed)
error 

## 2019-11-05 ENCOUNTER — Ambulatory Visit: Payer: Medicare Other | Admitting: Family Medicine

## 2019-11-09 ENCOUNTER — Ambulatory Visit: Payer: Medicare Other | Attending: Internal Medicine

## 2019-11-09 DIAGNOSIS — Z23 Encounter for immunization: Secondary | ICD-10-CM

## 2019-11-09 NOTE — Progress Notes (Signed)
   Covid-19 Vaccination Clinic  Name:  Donald Sweeney    MRN: [id number] DOB: 04/06/1981  11/09/2019  [name] was observed post Covid-19 immunization for 30 minutes based on pre-vaccination screening without incident. He was provided with Vaccine Information Sheet and instruction to access the V-Safe system.   [name] was instructed to call 911 with any severe reactions post vaccine: Marland Kitchen Difficulty breathing  . Swelling of face and throat  . A fast heartbeat  . A bad rash all over body  . Dizziness and weakness   Immunizations Administered    Name Date Dose VIS Date Route   Pfizer COVID-19 Vaccine 11/09/2019  3:07 PM 0.3 mL 09/08/2018 Intramuscular   Manufacturer: [employer], [employer]   Lot: [id number]   NDC: [address]-3

## 2019-11-11 ENCOUNTER — Other Ambulatory Visit: Payer: Self-pay

## 2019-11-11 ENCOUNTER — Ambulatory Visit: Payer: Medicare Other | Admitting: Family Medicine

## 2019-11-11 ENCOUNTER — Ambulatory Visit (INDEPENDENT_AMBULATORY_CARE_PROVIDER_SITE_OTHER): Payer: Medicare Other | Admitting: Family Medicine

## 2019-11-11 ENCOUNTER — Encounter: Payer: Self-pay | Admitting: Family Medicine

## 2019-11-11 ENCOUNTER — Ambulatory Visit (INDEPENDENT_AMBULATORY_CARE_PROVIDER_SITE_OTHER): Payer: Medicare Other

## 2019-11-11 VITALS — BP 120/80 | HR 93 | Ht 72.0 in | Wt 265.0 lb

## 2019-11-11 DIAGNOSIS — M546 Pain in thoracic spine: Secondary | ICD-10-CM

## 2019-11-11 DIAGNOSIS — M545 Low back pain, unspecified: Secondary | ICD-10-CM

## 2019-11-11 DIAGNOSIS — G8929 Other chronic pain: Secondary | ICD-10-CM

## 2019-11-11 DIAGNOSIS — M4124 Other idiopathic scoliosis, thoracic region: Secondary | ICD-10-CM | POA: Diagnosis not present

## 2019-11-11 DIAGNOSIS — M4184 Other forms of scoliosis, thoracic region: Secondary | ICD-10-CM | POA: Diagnosis not present

## 2019-11-11 DIAGNOSIS — S3992XA Unspecified injury of lower back, initial encounter: Secondary | ICD-10-CM | POA: Diagnosis not present

## 2019-11-11 MED ORDER — CYCLOBENZAPRINE HCL 10 MG PO TABS
10.0000 mg | ORAL_TABLET | Freq: Three times a day (TID) | ORAL | 0 refills | Status: AC | PRN
Start: 2019-11-11 — End: ?

## 2019-11-11 NOTE — Progress Notes (Signed)
Subjective:    I'm seeing this patient as a consultation for:  Dr. Jimmey Ralph. Note will be routed back to referring provider/PCP.  CC: Mid-back and upper back pain  I, Molly Weber, LAT, ATC, am serving as scribe for Dr. Clementeen Graham.  HPI: Pt is a 39 y/o male presenting w/ c/o mid-back and upper back pain x years.  He reports having fallen down some stairs on his back while moving a dresser approximately 8 years ago and has pain ever since.  He locates his pain to to his lower mid-back that radiates to his upper back.  He rates his pain as mod-severe and describes his pain as an intermittent sharp/aching pain.  Radiating pain: No Aggravating factors: Prolonged standing, sitting, forward flexion; playing w his kids Treatments tried: Naproxen, Tylenol, topical pain relieving gel/cream  He has 3 children.  His oldest is a 38 year old son who has cerebral palsy.  He has to take his son out of his wheelchair frequently.  His son weighs about 90 pounds. His youngest child is 32 years old and has autism and OCD.  Past medical history, Surgical history, Family history, Social history, Allergies, and medications have been entered into the medical record, reviewed.   Review of Systems: No new headache, visual changes, nausea, vomiting, diarrhea, constipation, dizziness, abdominal pain, skin rash, fevers, chills, night sweats, weight loss, swollen lymph nodes, body aches, joint swelling, muscle aches, chest pain, shortness of breath, mood changes, visual or auditory hallucinations.   Objective:    Vitals:   11/11/19 1316  BP: 120/80  Pulse: 93  SpO2: 96%   General: Well Developed, well nourished, and in no acute distress.  Neuro/Psych: Alert and oriented x3, extra-ocular muscles intact, able to move all 4 extremities, sensation grossly intact. Skin: Warm and dry, no rashes noted.  Respiratory: Not using accessory muscles, speaking in full sentences, trachea midline.  Cardiovascular: Pulses  palpable, no extremity edema. Abdomen: Does not appear distended. MSK:  T-spine: Normal-appearing Nontender spinal midline. Mildly tender palpation left thoracic paraspinal musculature. Normal upper extremity motion. L-spine normal-appearing Nontender spinal midline. Mildly tender palpation left lumbar paraspinal musculature. Normal lumbar motion. Lower extremity strength reflexes and sensation are intact distally.  Lab and Radiology Results  X-ray images T-spine and L-spine obtained today personally independently reviewed.  T-spine: Resting scoliosis convex left.  No acute fractures visible  L-spine: Mild to moderate DDD and facet DJD L5-S1.  No acute fractures.  Await formal radiology review  Impression and Recommendations:    Assessment and Plan: 39 y.o. male with chronic left low back and thoracic pain.  Pain started after he fell down some stairs and had a dresser landed on top of him.  I do not see obvious fracture on x-ray today however radiology overread is still pending.  Dominant finding is scoliosis of the thoracic spine.  Most likely treatment to benefit him is physical therapy.  We will place referral to physical therapy.  Limited prescription for cyclobenzaprine.  Recommend also heating pad and TENS unit.  Recheck back in 6 weeks return sooner if needed.Marland Kitchen  PDMP not reviewed this encounter. Orders Placed This Encounter  Procedures  . DG Lumbar Spine 2-3 Views    Standing Status:   Future    Number of Occurrences:   1    Standing Expiration Date:   01/10/2021    Order Specific Question:   Reason for Exam (SYMPTOM  OR DIAGNOSIS REQUIRED)    Answer:   eval pain left  left back    Order Specific Question:   Preferred imaging location?    Answer:   [facility]    Order Specific Question:   Radiology Contrast Protocol - do NOT remove file path    Answer:   \\[email or web address]s.pdf  . DG Thoracic Spine 2 View    Standing Status:    Future    Number of Occurrences:   1    Standing Expiration Date:   01/10/2021    Order Specific Question:   Reason for Exam (SYMPTOM  OR DIAGNOSIS REQUIRED)    Answer:   eval pain left tspine    Order Specific Question:   Preferred imaging location?    Answer:   [facility]    Order Specific Question:   Radiology Contrast Protocol - do NOT remove file path    Answer:   \\[email or web address]\[email or web address]\DXFluoroContrastProtocols.pdf  . Ambulatory referral to Physical Therapy    Referral Priority:   Routine    Referral Type:   Physical Medicine    Referral Reason:   Specialty Services Required    Requested Specialty:   Physical Therapy   Meds ordered this encounter  Medications  . cyclobenzaprine (FLEXERIL) 10 MG tablet    Sig: Take 1 tablet (10 mg total) by mouth 3 (three) times daily as needed for muscle spasms. Take mostly at bedtime    Dispense:  30 tablet    Refill:  0    Discussed warning signs or symptoms. Please see discharge instructions. Patient expresses understanding.   The above documentation has been reviewed and is accurate and complete Lynne Leader

## 2019-11-11 NOTE — Patient Instructions (Signed)
Thank you for coming in today. Get xrays today.  Attend PT.  Let me know if you have a problem getting an appointment.  Try flexeril muscle relaxer mostly at bedtime.  Heating pad my be helpful.  TENS UNIT: This is helpful for muscle pain and spasm.   Search and Purchase a TENS 7000 2nd edition at  www.tenspros.com or www.Amazon.com It should be less than $30.     TENS unit instructions: Do not shower or bathe with the unit on Turn the unit off before removing electrodes or batteries If the electrodes lose stickiness add a drop of water to the electrodes after they are disconnected from the unit and place on plastic sheet. If you continued to have difficulty, call the TENS unit company to purchase more electrodes. Do not apply lotion on the skin area prior to use. Make sure the skin is clean and dry as this will help prolong the life of the electrodes. After use, always check skin for unusual red areas, rash or other skin difficulties. If there are any skin problems, does not apply electrodes to the same area. Never remove the electrodes from the unit by pulling the wires. Do not use the TENS unit or electrodes other than as directed. Do not change electrode placement without consultating your therapist or physician. Keep 2 fingers with between each electrode. Wear time ratio is 2:1, on to off times.    For example on for 30 minutes off for 15 minutes and then on for 30 minutes off for 15 minutes     Lumbosacral Strain Lumbosacral strain is an injury that causes pain in the lower back (lumbosacral spine). This injury usually happens from overstretching the muscles or ligaments along your spine. Ligaments are cord-like tissues that connect bones to other bones. A strain can affect one or more muscles or ligaments. What are the causes? This condition may be caused by:  A hard, direct hit to the back.  Overstretching the lower back muscles. This may result from: ? A  fall. ? Lifting something heavy. ? Repetitive movements such as bending or crouching. What increases the risk? The following factors may make you more likely to develop this condition:  Participating in sports or activities that involve: ? A sudden twist of the back. ? Pushing or pulling motions.  Being overweight or obese.  Having poor strength and flexibility, especially tight hamstrings or weak muscles in the back or abdomen.  Having too much of a curve in the lower back.  Having a pelvis that is tilted forward. What are the signs or symptoms? The main symptom of this condition is pain in the lower back, at the site of the strain. Pain may also be felt down one or both legs. How is this diagnosed? This condition is diagnosed based on your symptoms, your medical history, and a physical exam. During the physical exam, your health care provider may push on certain areas of your back to find the source of your pain. You may be asked to bend forward, backward, and side to side to check your pain and range of motion. You may also have imaging tests, such as X-rays and an MRI. How is this treated? This condition may be treated by:  Applying heat and cold on the affected area.  Taking medicines to help relieve pain and relax your muscles.  Taking NSAIDs, such as ibuprofen, to help reduce swelling and discomfort.  Doing stretching and strengthening exercises for your lower back. Symptoms  usually improve within several weeks of treatment. However, recovery time varies. When your symptoms improve, gradually return to your normal routine as soon as possible to reduce pain, avoid stiffness, and keep muscle strength. Follow these instructions at home: Medicines  Take over-the-counter and prescription medicines only as told by your health care provider.  Ask your health care provider if the medicine prescribed to you: ? Requires you to avoid driving or using heavy machinery. ? Can cause  constipation. You may need to take these actions to prevent or treat constipation:  Drink enough fluid to keep your urine pale yellow.  Take over-the-counter or prescription medicines.  Eat foods that are high in fiber, such as beans, whole grains, and fresh fruits and vegetables.  Limit foods that are high in fat and processed sugars, such as fried or sweet foods. Managing pain, stiffness, and swelling      If directed, put ice on the injured area. To do this: ? Put ice in a plastic bag. ? Place a towel between your skin and the bag. ? Leave the ice on for 20 minutes, 2-3 times a day.  If directed, apply heat on the affected area as often as told by your health care provider. Use the heat source that your health care provider recommends, such as a moist heat pack or a heating pad. ? Place a towel between your skin and the heat source. ? Leave the heat on for 20-30 minutes. ? Remove the heat if your skin turns bright red. This is especially important if you are unable to feel pain, heat, or cold. You may have a greater risk of getting burned. Activity  Rest as told by your health care provider.  Do not stay in bed. Staying in bed for more than 1-2 days can delay your recovery.  Return to your normal activities as told by your health care provider. Ask your health care provider what activities are safe for you.  Avoid activities that take a lot of energy for as long as told by your health care provider.  Do exercises as told by your health care provider. This includes stretching and strengthening exercises. General instructions  Sit up and stand up straight. Avoid leaning forward when you sit, or hunching over when you stand.  Do not use any products that contain nicotine or tobacco, such as cigarettes, e-cigarettes, and chewing tobacco. If you need help quitting, ask your health care provider.  Keep all follow-up visits as told by your health care provider. This is  important. How is this prevented?   Use correct form when playing sports and lifting heavy objects.  Use good posture when sitting and standing.  Maintain a healthy weight.  Sleep on a mattress with medium firmness to support your back.  Do at least 150 minutes of moderate-intensity exercise each week, such as brisk walking or water aerobics. Try a form of exercise that takes stress off your back, such as swimming or stationary cycling.  Maintain physical fitness, including: ? Strength. ? Flexibility. Contact a health care provider if:  Your back pain does not improve after several weeks of treatment.  Your symptoms get worse. Get help right away if:  Your back pain is severe.  You cannot stand or walk.  You have difficulty controlling when you urinate or when you have a bowel movement.  You feel nauseous or you vomit.  Your feet or legs get very cold, turn pale, or look blue.  You have numbness,  tingling, weakness, or problems using your arms or legs.  You develop any of the following: ? Shortness of breath. ? Dizziness. ? Pain in your legs. ? Weakness in your buttocks or legs. Summary  Lumbosacral strain is an injury that causes pain in the lower back (lumbosacral spine).  This injury usually happens from overstretching the muscles or ligaments along your spine.  This condition may be caused by a direct hit to the lower back or by overstretching the lower back muscles.  Symptoms usually improve within several weeks of treatment. This information is not intended to replace advice given to you by your health care provider. Make sure you discuss any questions you have with your health care provider. Document Revised: 11/24/2018 Document Reviewed: 11/24/2018 Elsevier Patient Education  [place].

## 2019-11-12 NOTE — Progress Notes (Signed)
X-ray lumbar spine looks pretty normal

## 2019-11-12 NOTE — Progress Notes (Signed)
X-ray thoracic spine does not show fractures but it does show small scoliosis present.  Physical therapy will likely help with this.

## 2019-11-15 ENCOUNTER — Ambulatory Visit: Payer: Medicare Other | Attending: Family Medicine

## 2019-11-15 ENCOUNTER — Other Ambulatory Visit: Payer: Self-pay

## 2019-11-15 DIAGNOSIS — M545 Low back pain, unspecified: Secondary | ICD-10-CM

## 2019-11-15 DIAGNOSIS — M546 Pain in thoracic spine: Secondary | ICD-10-CM

## 2019-11-15 DIAGNOSIS — M6289 Other specified disorders of muscle: Secondary | ICD-10-CM

## 2019-11-16 NOTE — Therapy (Signed)
[place], [place], [address] Phone: [phone]   Fax:  [phone]  Physical Therapy Evaluation  Patient Details  Name: Donald Sweeney MRN: [id number] Date of Birth: 08-28-1980 Referring Provider (PT): Gregor Hams, MD   Encounter Date: 11/15/2019  PT End of Session - 11/16/19 1227    Visit Number  1    Number of Visits  13    Date for PT Re-Evaluation  01/11/20    Authorization Type  Medicare    Progress Note Due on Visit  10    PT Start Time  1615    PT Stop Time  1715    PT Time Calculation (min)  60 min    Activity Tolerance  Patient tolerated treatment well    Behavior During Therapy  [facility] for tasks assessed/performed       Past Medical History:  Diagnosis Date  . Frequent headaches   . GERD (gastroesophageal reflux disease)   . History of fainting spells of unknown cause   . Hyperlipidemia   . Hypertension     History reviewed. No pertinent surgical history.  There were no vitals filed for this visit.   Subjective Assessment - 11/15/19 1639    Subjective  Pt reports a MOI occuring approx. 8 years ago when he fell backwards down steps c a dresser when helping with a move. He reports since he has experienced intermittent mid to low back pain happening approx. every 2 weeks with prolonged forward reaching activities (washing dishes) or heavy lifting. Pt is the primary CG for his 28 yo son who has CP and is WC dependent and for a 28 yo son c autism. Pt reports needing to lift his 38 yo who weighs 95 lbs 3-5x a day. Additionally, pt reports his pain can occur without a significant incident, and in the past he has  tolerated working out with wt. machines, treadmill, and ex bike without issue. Currently he rates his pain a 2/10, but states it is 8/10 when the intermittent incidents occur. He also reports the sensation of bowel pressure with some of these incidents. Pt denies LE pain.    How long can  you sit comfortably?  2 hours    How long can you stand comfortably?  30 mins    How long can you walk comfortably?  30 mins    Diagnostic tests  X-rays; Thoracic-FINDINGS:Reverse S shaped scoliosis of the upper thoracic spine. Sagittalalignment within normal limits. Vertebral body heights are maintained. IMPRESSION:Scoliosis.  No acute osseous abnormality. Lumbar: FINDINGS:There is no evidence of lumbar spine fracture. Alignment is normal.Intervertebral disc spaces are maintained. IMPRESSION:Negative.    Patient Stated Goals  To be able to function in less pain.    Currently in Pain?  Yes    Pain Score  2     Pain Location  Back    Pain Orientation  Mid;Lower    Pain Descriptors / Indicators  Aching;Throbbing    Pain Type  Chronic pain    Pain Onset  More than a month ago    Pain Frequency  Intermittent    Aggravating Factors   Prolonged reaching and heavy lifting    Pain Relieving Factors  lying down, elevate feet, hot shower, icy hot    Effect of Pain on Daily Activities  Significantly limits when in higher levels of pain         [facility] PT Assessment - 11/16/19 0001  Assessment   Medical Diagnosis  Mid to low back pain s sciatica    Referring Provider (PT)  Rodolph Bong, MD    Onset Date/Surgical Date  --   Approx 8 years ago   Hand Dominance  Right      Precautions   Precautions  None      Restrictions   Weight Bearing Restrictions  No      Balance Screen   Has the patient fallen in the past 6 months  No    Has the patient had a decrease in activity level because of a fear of falling?   No    Is the patient reluctant to leave their home because of a fear of falling?   No      Home Public house manager residence    Living Arrangements  Spouse/significant other;Children    Type of Home  House    Home Access  Ramped entrance    Home Layout  One level    Home Equipment  [employer] - single point      Prior Function   Level of Independence   Independent    Vocation  On disability    Leisure  CG for children      Cognition   Overall Cognitive Status  Within Functional Limits for tasks assessed      Observation/Other Assessments   Focus on Therapeutic Outcomes (FOTO)   65% Limited      Sensation   Light Touch  Impaired by gross assessment      Coordination   Gross Motor Movements are Fluid and Coordinated  Yes      Posture/Postural Control   Posture/Postural Control  Postural limitations    Postural Limitations  Rounded Shoulders    Posture Comments  Reverse S shape scoliosis thoracic       Tone   Assessment Location  --      ROM / Strength   AROM / PROM / Strength  AROM      AROM   AROM Assessment Site  Lumbar;Thoracic    Lumbar Flexion  90   Pain at endrange   Lumbar Extension  60   pain at endrange   Lumbar - Right Side Bend  30   pain at endrange   Lumbar - Left Side Bend  30   Pain at endrange   Lumbar - Right Rotation  Full   Pain at endrange   Lumbar - Left Rotation  Full   no pain at endrange     Palpation   Palpation comment  Increased paraspinal muscle tension from T7 to L1. R shoulder slightly lower. Iliac crest, PSIS, ASIS, GT      Ambulation/Gait   Gait Pattern  Within Functional Limits                Objective measurements completed on examination: See above findings.              PT Education - 11/16/19 1225    Education Details  [place] of eval, POC, HEP, proper body mechanics for husehold activities and lifting.    Person(s) Educated  Patient    Methods  Explanation;Demonstration;Tactile cues;Verbal cues;Handout    Comprehension  Verbalized understanding;Returned demonstration;Verbal cues required;Tactile cues required;Need further instruction       PT Short Term Goals - 11/16/19 1244      PT SHORT TERM GOAL #1   Title  Pt will be Ind in a  HEP to address mid to low back tightness, strength and pain.    Baseline  No program    Time  3    Period  Weeks     Status  New    Target Date  12/07/19      PT SHORT TERM GOAL #2   Title  Pt will demonstrate appropriate body mechanics for household activities and lifting.    Baseline  Limited    Time  3    Period  Weeks    Status  New    Target Date  12/07/19        PT Long Term Goals - 11/16/19 1250      PT LONG TERM GOAL #1   Title  Pt will report being able to complete household activities c little difficulty    Baseline  extreme difficulty    Time  8    Period  Weeks    Status  New    Target Date  01/11/20      PT LONG TERM GOAL #2   Title  Pt will be able to complete lifting of his son c moderate difficulty    Baseline  extreme difficulty    Time  8    Period  Weeks    Status  New    Target Date  01/11/20      PT LONG TERM GOAL #3   Title  Pt will report improved pain range from 2-8/10 to 0-4 with daily activities    Baseline  2-8/10    Time  8    Period  Weeks    Status  New    Target Date  01/11/20      PT LONG TERM GOAL #4   Title  Pt's FOTO limitation measure will improve to predicted of 46%    Baseline  65% on eval    Time  8    Period  Weeks    Status  New    Target Date  01/11/20      PT LONG TERM GOAL #5   Title  Pt will be Ind with a final HEP    Baseline  No program    Time  8    Period  Weeks    Status  New    Target Date  01/11/20             Plan - 11/16/19 1237    Clinical Impression Statement  Intermittent incidents of increased pain are usually to 8/10, and then gradually decrease. Pt will benefit from PT for trunk flexibility, strengthening/stability, pain management, and back care.    Personal Factors and Comorbidities  Comorbidity 1    Comorbidities  HTN    Examination-Activity Limitations  Caring for Others;Carry;Lift;Reach Overhead    Stability/Clinical Decision Making  Stable/Uncomplicated    Clinical Decision Making  Low    Rehab Potential  Good    PT Frequency  2x / week    PT Duration  6 weeks    PT Treatment/Interventions   ADLs/Self Care Home Management;Cryotherapy;Electrical Stimulation;Ultrasound;Traction;Moist Heat;Iontophoresis 4mg /ml Dexamethasone;Functional mobility training;Therapeutic activities;Therapeutic exercise;Neuromuscular re-education;Manual techniques;Patient/family education;Dry needling;Taping    PT Next Visit Plan  Assess response to ther ex and body mechanic instruction    PT Home Exercise Plan  [id number]. Cat and quad thoracic reach through    Consulted and Agree with Plan of Care  Patient       Patient will benefit from skilled therapeutic intervention in order to improve  the following deficits and impairments:  Improper body mechanics, Decreased knowledge of precautions, Hypermobility, Pain, Obesity, Impaired UE functional use, Increased muscle spasms  Visit Diagnosis: Pain in thoracic spine - Plan: PT plan of care cert/re-cert  Lumbar back pain - Plan: PT plan of care cert/re-cert  Muscle tightness - Plan: PT plan of care cert/re-cert     Problem List Patient Active Problem List   Diagnosis Date Noted  . Other idiopathic scoliosis, thoracic region 11/11/2019  . Loud snoring 11/23/2018  . History of night terrors 11/23/2018  . Hypermobility of joint 11/16/2018  . Fatigue 11/16/2018  . Major depression in remission (HCC) 11/16/2018  . Cubital tunnel syndrome, bilateral 04/14/2018     Joellyn Rued MS, PT 11/16/19 1:30 PM  [facility] [address] [place], [place], [address] Phone: [phone]   Fax:  [phone]  Name: Donald Sweeney MRN: [id number] Date of Birth: 22-Mar-1981

## 2019-11-26 ENCOUNTER — Ambulatory Visit: Payer: Medicare Other | Admitting: Physical Therapy

## 2019-12-01 ENCOUNTER — Ambulatory Visit (HOSPITAL_COMMUNITY): Payer: Self-pay | Admitting: Psychiatry

## 2019-12-03 ENCOUNTER — Ambulatory Visit: Payer: Medicare Other | Attending: Internal Medicine

## 2019-12-03 ENCOUNTER — Ambulatory Visit: Payer: Medicare Other | Admitting: Physical Therapy

## 2019-12-03 DIAGNOSIS — Z20822 Contact with and (suspected) exposure to covid-19: Secondary | ICD-10-CM

## 2019-12-04 LAB — SARS-COV-2, NAA 2 DAY TAT

## 2019-12-04 LAB — NOVEL CORONAVIRUS, NAA: SARS-CoV-2, NAA: NOT DETECTED

## 2019-12-06 ENCOUNTER — Ambulatory Visit: Payer: Medicare Other | Admitting: Rehabilitative and Restorative Service Providers"

## 2019-12-10 ENCOUNTER — Other Ambulatory Visit: Payer: Self-pay

## 2019-12-10 ENCOUNTER — Ambulatory Visit: Payer: Medicare Other

## 2019-12-10 DIAGNOSIS — M546 Pain in thoracic spine: Secondary | ICD-10-CM | POA: Diagnosis not present

## 2019-12-10 DIAGNOSIS — M6289 Other specified disorders of muscle: Secondary | ICD-10-CM

## 2019-12-10 DIAGNOSIS — M545 Low back pain, unspecified: Secondary | ICD-10-CM

## 2019-12-10 NOTE — Therapy (Signed)
[facility] [address] [place], [place], [address] Phone: [phone]   Fax:  [phone]  Physical Therapy Treatment  Patient Details  Name: Rion Catala MRN: [id number] Date of Birth: 1980/10/21 Referring Provider (PT): Rodolph Bong, MD   Encounter Date: 12/10/2019  PT End of Session - 12/10/19 2053    Visit Number  2    Number of Visits  13    Date for PT Re-Evaluation  01/11/20    Authorization Type  Medicare    Progress Note Due on Visit  10    PT Start Time  1238    PT Stop Time  1321    PT Time Calculation (min)  43 min    Activity Tolerance  Patient tolerated treatment well    Behavior During Therapy  [facility] for tasks assessed/performed       Past Medical History:  Diagnosis Date  . Frequent headaches   . GERD (gastroesophageal reflux disease)   . History of fainting spells of unknown cause   . Hyperlipidemia   . Hypertension     History reviewed. No pertinent surgical history.  There were no vitals filed for this visit.  Subjective Assessment - 12/10/19 1258    Subjective  Pt reports he has been completing his exs and his mid back pain has been better. He has had less incidents of high pain and his overall pain is less. Currently, he rates mid back pain a 4/10.    Currently in Pain?  Yes    Pain Score  4     Pain Location  Back    Pain Orientation  Mid    Pain Descriptors / Indicators  Aching;Throbbing    Pain Type  Chronic pain    Pain Onset  More than a month ago                        [facility] Adult PT Treatment/Exercise - 12/10/19 0001      Self-Care   Self-Care  Lifting    Lifting  Hinged hip; 25 lbs KB; 10x; chair seat height to chest      Exercises   Exercises  Lumbar;Knee/Hip      Lumbar Exercises: Stretches   Lower Trunk Rotation  5 reps    Lower Trunk Rotation Limitations  2 sets; 5 sec    Quadruped Mid Back Stretch  3 reps;20 seconds    Quadruped Mid Back Stretch  Limitations  Cat      Lumbar Exercises: Supine   Pelvic Tilt  15 reps    Pelvic Tilt Limitations  3 sec    Bridge  15 reps      Lumbar Exercises: Prone   Single Arm Raise  15 reps;2 seconds    Straight Leg Raise  15 reps;2 seconds      Lumbar Exercises: Quadruped   Other Quadruped Lumbar Exercises  thoracic rotation c arm reach through 3x, 20 sec             PT Education - 12/10/19 2050    Education Details  Proper lifting technique. HEP-added exs for LE/core flexibility and strengthening    Person(s) Educated  Patient    Methods  Explanation;Demonstration;Tactile cues;Verbal cues;Handout    Comprehension  Verbalized understanding;Returned demonstration;Verbal cues required;Tactile cues required;Need further instruction       PT Short Term Goals - 11/16/19 1244      PT SHORT TERM GOAL #1   Title  Pt will be Ind in a HEP to address mid to low back tightness, strength and pain.    Baseline  No program    Time  3    Period  Weeks    Status  New    Target Date  12/07/19      PT SHORT TERM GOAL #2   Title  Pt will demonstrate appropriate body mechanics for household activities and lifting.    Baseline  Limited    Time  3    Period  Weeks    Status  New    Target Date  12/07/19        PT Long Term Goals - 11/16/19 1250      PT LONG TERM GOAL #1   Title  Pt will report being able to complete household activities c little difficulty    Baseline  extreme difficulty    Time  8    Period  Weeks    Status  New    Target Date  01/11/20      PT LONG TERM GOAL #2   Title  Pt will be able to complete lifting of his son c moderate difficulty    Baseline  extreme difficulty    Time  8    Period  Weeks    Status  New    Target Date  01/11/20      PT LONG TERM GOAL #3   Title  Pt will report improved pain range from 2-8/10 to 0-4 with daily activities    Baseline  2-8/10    Time  8    Period  Weeks    Status  New    Target Date  01/11/20      PT LONG TERM GOAL  #4   Title  Pt's FOTO limitation measure will improve to predicted of 46%    Baseline  65% on eval    Time  8    Period  Weeks    Status  New    Target Date  01/11/20      PT LONG TERM GOAL #5   Title  Pt will be Ind with a final HEP    Baseline  No program    Time  8    Period  Weeks    Status  New    Target Date  01/11/20            Plan - 12/10/19 2054    Clinical Impression Statement  Pt participated well in PT for education and completion of proper lifting techniques and ther ex for LE/Core flexibility and strengthening exs. Pt reports he has been completing the exs provided on the eval and his back pain has been overall improved.    Personal Factors and Comorbidities  Comorbidity 1    Comorbidities  HTN    Examination-Activity Limitations  Caring for Others;Carry;Lift;Reach Overhead    Stability/Clinical Decision Making  Stable/Uncomplicated    Clinical Decision Making  Low    Rehab Potential  Good    PT Frequency  2x / week    PT Duration  6 weeks    PT Treatment/Interventions  ADLs/Self Care Home Management;Cryotherapy;Electrical Stimulation;Ultrasound;Traction;Moist Heat;Iontophoresis 4mg /ml Dexamethasone;Functional mobility training;Therapeutic activities;Therapeutic exercise;Neuromuscular re-education;Manual techniques;Patient/family education;Dry needling;Taping    PT Next Visit Plan  Assess and review lifting and added ther ex    PT Home Exercise Plan  [id number]. LE/Core flexibility and strengthening exs       Patient will benefit from skilled therapeutic  intervention in order to improve the following deficits and impairments:  Improper body mechanics, Decreased knowledge of precautions, Hypermobility, Pain, Obesity, Impaired UE functional use, Increased muscle spasms  Visit Diagnosis: Pain in thoracic spine  Lumbar back pain  Muscle tightness     Problem List Patient Active Problem List   Diagnosis Date Noted  . Other idiopathic scoliosis, thoracic  region 11/11/2019  . Loud snoring 11/23/2018  . History of night terrors 11/23/2018  . Hypermobility of joint 11/16/2018  . Fatigue 11/16/2018  . Major depression in remission (HCC) 11/16/2018  . Cubital tunnel syndrome, bilateral 04/14/2018    Joellyn Rued MS, PT 12/10/19 9:15 PM  [facility] [address] [place], [place], [address] Phone: [phone]   Fax:  [phone]  Name: Isacc Turney MRN: [id number] Date of Birth: [birth year]

## 2019-12-15 ENCOUNTER — Ambulatory Visit: Payer: Medicare Other

## 2019-12-17 ENCOUNTER — Ambulatory Visit: Payer: Medicare Other

## 2019-12-20 ENCOUNTER — Other Ambulatory Visit: Payer: Self-pay

## 2019-12-20 ENCOUNTER — Ambulatory Visit: Payer: Medicare Other | Attending: Family Medicine

## 2019-12-20 DIAGNOSIS — M546 Pain in thoracic spine: Secondary | ICD-10-CM | POA: Diagnosis present

## 2019-12-20 DIAGNOSIS — M545 Low back pain, unspecified: Secondary | ICD-10-CM

## 2019-12-20 DIAGNOSIS — M6289 Other specified disorders of muscle: Secondary | ICD-10-CM | POA: Diagnosis present

## 2019-12-21 ENCOUNTER — Encounter (HOSPITAL_COMMUNITY): Payer: Self-pay | Admitting: Psychiatry

## 2019-12-21 ENCOUNTER — Telehealth (INDEPENDENT_AMBULATORY_CARE_PROVIDER_SITE_OTHER): Payer: Medicare Other | Admitting: Psychiatry

## 2019-12-21 VITALS — Wt 269.0 lb

## 2019-12-21 DIAGNOSIS — F419 Anxiety disorder, unspecified: Secondary | ICD-10-CM | POA: Diagnosis not present

## 2019-12-21 DIAGNOSIS — F319 Bipolar disorder, unspecified: Secondary | ICD-10-CM

## 2019-12-21 MED ORDER — BUPROPION HCL ER (XL) 150 MG PO TB24: 150.0000 mg | ORAL_TABLET | Freq: Every day | ORAL | 0 refills | Status: DC

## 2019-12-21 MED ORDER — ARIPIPRAZOLE 5 MG PO TABS: ORAL_TABLET | ORAL | 0 refills | Status: DC

## 2019-12-21 NOTE — Therapy (Signed)
[facility] [address] [place], [place], [address] Phone: [phone]   Fax:  [phone]  Physical Therapy Treatment  Patient Details  Name: Donald Sweeney MRN: [id number] Date of Birth: [birth year] Referring Provider (PT): Rodolph Bong, MD   Encounter Date: 12/20/2019  PT End of Session - 12/20/19 1403    Visit Number  3    Number of Visits  13    Date for PT Re-Evaluation  01/11/20    Authorization Type  Medicare    Progress Note Due on Visit  10    PT Start Time  1135    PT Stop Time  1222    PT Time Calculation (min)  47 min    Activity Tolerance  Patient tolerated treatment well    Behavior During Therapy  [facility] for tasks assessed/performed       Past Medical History:  Diagnosis Date  . Frequent headaches   . GERD (gastroesophageal reflux disease)   . History of fainting spells of unknown cause   . Hyperlipidemia   . Hypertension     History reviewed. No pertinent surgical history.  There were no vitals filed for this visit.  Subjective Assessment - 12/20/19 1143    Subjective  Pt continues to report gradual improvementin his back pain. Rates a 3/10 today. Pt reports no significant increases in pain and is having only back twinges with lifting his son.    Pain Score  3     Pain Location  Back    Pain Orientation  Posterior;Mid    Pain Descriptors / Indicators  Aching    Pain Type  Chronic pain    Pain Onset  More than a month ago    Pain Frequency  Intermittent                        [facility] Adult PT Treatment/Exercise - 12/21/19 0001      Self-Care   Self-Care  Lifting    Lifting  Hinged hip; 25 lbs KB; 10x2; chair seat height to chest      Exercises   Exercises  Lumbar;Knee/Hip      Lumbar Exercises: Stretches   Lower Trunk Rotation  5 reps    Lower Trunk Rotation Limitations  2 sets; 5 sec    Quadruped Mid Back Stretch  3 reps;20 seconds    Quadruped Mid Back Stretch Limitations   Cat      Lumbar Exercises: Aerobic   Nustep  5 mins; L5; arms and legs      Lumbar Exercises: Supine   Pelvic Tilt  15 reps    Pelvic Tilt Limitations  3 sec    Bent Knee Raise  15 reps;2 seconds    Bent Knee Raise Limitations  each LE    Bridge  15 reps    Bridge Limitations  c abs      Lumbar Exercises: Sidelying   Clam  Right;Left;15 reps;2 seconds    Hip Abduction  Right;Left;15 reps;2 seconds      Lumbar Exercises: Prone   Opposite Arm/Leg Raise  Right arm/Left leg;Left arm/Right leg;15 reps;2 seconds      Lumbar Exercises: Quadruped   Other Quadruped Lumbar Exercises  thoracic rotation full motion 3x10sec              PT Education - 12/20/19 1401    Education Details  Proper lifting technique. Core/hip strengthening exs added. trunk flexibility ex. progressed to full  motion.    Person(s) Educated  Patient    Methods  Explanation;Demonstration;Tactile cues;Verbal cues;Handout    Comprehension  Verbalized understanding;Returned demonstration;Verbal cues required;Tactile cues required;Need further instruction       PT Short Term Goals - 11/16/19 1244      PT SHORT TERM GOAL #1   Title  Pt will be Ind in a HEP to address mid to low back tightness, strength and pain.    Baseline  No program    Time  3    Period  Weeks    Status  New    Target Date  12/07/19      PT SHORT TERM GOAL #2   Title  Pt will demonstrate appropriate body mechanics for household activities and lifting.    Baseline  Limited    Time  3    Period  Weeks    Status  New    Target Date  12/07/19        PT Long Term Goals - 11/16/19 1250      PT LONG TERM GOAL #1   Title  Pt will report being able to complete household activities c little difficulty    Baseline  extreme difficulty    Time  8    Period  Weeks    Status  New    Target Date  01/11/20      PT LONG TERM GOAL #2   Title  Pt will be able to complete lifting of his son c moderate difficulty    Baseline  extreme  difficulty    Time  8    Period  Weeks    Status  New    Target Date  01/11/20      PT LONG TERM GOAL #3   Title  Pt will report improved pain range from 2-8/10 to 0-4 with daily activities    Baseline  2-8/10    Time  8    Period  Weeks    Status  New    Target Date  01/11/20      PT LONG TERM GOAL #4   Title  Pt's FOTO limitation measure will improve to predicted of 46%    Baseline  65% on eval    Time  8    Period  Weeks    Status  New    Target Date  01/11/20      PT LONG TERM GOAL #5   Title  Pt will be Ind with a final HEP    Baseline  No program    Time  8    Period  Weeks    Status  New    Target Date  01/11/20            Plan - 12/21/19 7001    Clinical Impression Statement  Pt reports he is completing his HEP and trying to lift his son with better technique including engaging his abdomials. Pt demomstrated proper lifting today. Abdominal, hip, back strengthening exercies were progressed to and tolerated well.    Personal Factors and Comorbidities  Comorbidity 1    Comorbidities  HTN    Examination-Activity Limitations  Caring for Others;Carry;Lift;Reach Overhead    Stability/Clinical Decision Making  Stable/Uncomplicated    Rehab Potential  Good    PT Frequency  2x / week    PT Duration  6 weeks    PT Treatment/Interventions  ADLs/Self Care Home Management;Cryotherapy;Electrical Stimulation;Ultrasound;Traction;Moist Heat;Iontophoresis 4mg /ml Dexamethasone;Functional mobility training;Therapeutic activities;Therapeutic exercise;Neuromuscular re-education;Manual techniques;Patient/family education;Dry  needling;Taping    PT Next Visit Plan  Review proper body mchanics for household activities    PT Home Exercise Plan  [id number]. LE/Core/hip flexibility and strengthening exs progressed       Patient will benefit from skilled therapeutic intervention in order to improve the following deficits and impairments:  Improper body mechanics, Decreased knowledge of  precautions, Hypermobility, Pain, Obesity, Impaired UE functional use, Increased muscle spasms  Visit Diagnosis: Pain in thoracic spine  Lumbar back pain  Muscle tightness     Problem List Patient Active Problem List   Diagnosis Date Noted  . Other idiopathic scoliosis, thoracic region 11/11/2019  . Loud snoring 11/23/2018  . History of night terrors 11/23/2018  . Hypermobility of joint 11/16/2018  . Fatigue 11/16/2018  . Major depression in remission ([place]) 11/16/2018  . Cubital tunnel syndrome, bilateral 04/14/2018    Gar Ponto MS, PT 12/21/19 6:38 AM  [facility] [address] [place], [place], [address] Phone: [phone]   Fax:  [phone]  Name: Donald Sweeney MRN: [id number] Date of Birth: Sep 18, 1980

## 2019-12-21 NOTE — Progress Notes (Signed)
Virtual Visit via Video Note  I connected with Donald Sweeney on 12/21/19 at  1:00 PM EDT by a video enabled telemedicine application and verified that I am speaking with the correct person using two identifiers.   I discussed the limitations of evaluation and management by telemedicine and the availability of in person appointments. The patient expressed understanding and agreed to proceed.    [facility] Initial Assessment Note  Tresten Pantoja [id number] 39 y.o.  12/21/2019 1:08 PM   Patient Location: In his vehicle Provider Location: Home office  Chief Complaint:  I need to go back on my medication.  History of Present Illness:  Donald Sweeney is 39 year old African-American, on disability, married man who is self-referred to get help for his psychiatric symptoms.  Patient diagnosed with bipolar disorder and history of inpatient in 2012 after taking overdose on pain medicine and alcohol.  He was seeing Dr. Dub Mikes and then Dr. Toni Arthurs until covered happens and then he has been not taking his medication.  He tried to go back to Dr. Toni Arthurs but his insurance changed and he needs a new psychiatrist.  Patient told since he stopped the medication he was doing okay but until 2 months ago he started to have irritability, anger, paranoia, trust issues, having auditory hallucination and passive and fleeting suicidal thoughts.  He endorses anger is worse then he gets easily irritable which he describes yelling, throwing things for no reason.  He lives with his wife and 3 kids who are 40, 72 and 61 years old.  His wife is working.  He feels that he need to go back on medication because when he was taking the medication his mood was stable.  Now he feels sometimes hopeless, anhedonia, having crying spells and severe anxiety.  He admitted weight gain because he is not as active.  He sleeps a few hours and he reported having racing thoughts.  He worries about everything.  He denies any homicidal  thoughts, OCD symptoms, PTSD symptoms, panic attack.  However he described his mood is irritable.  In the past he has taken lithium, Lamictal which she recall cause weight gain.  He took carbamazepine, Wellbutrin and Seroquel but remember Seroquel made him lethargic and sedated.  He took Adderall and Ritalin to help his focus and attention.  He is currently not drinking or using any illegal substances.  He is also not in therapy but like to have a therapist to help his coping skills.  He is trying to be more healthy and started plant-based diet so he can lose weight.  He has a back pain and recently started physical therapy.  He has a blood work in April and his basic laboratory was normal.  Past Psychiatric History: History of inpatient in 2012 after taking overdose on pain medicine and alcohol.  History of paranoia, trust issue, hallucination, suicidal attempt, anger and poor impulse control.  Had tried lithium, Lamictal, Adderall, Ritalin, Seroquel, Tegretol and Abilify.  He remembers Abilify did well but he could not afford at that time.  Saw Dr. Dub Mikes and Dr. Toni Arthurs in the past.  Did IOP in 2012.  Family History: Maternal grandmother has history of schizophrenia and drug use.  Past Medical History:  Diagnosis Date  . Frequent headaches   . GERD (gastroesophageal reflux disease)   . History of fainting spells of unknown cause   . Hyperlipidemia   . Hypertension      Traumatic brain injury: No h/o TBI  Work History; On Disability  Psychosocial History; Patient born and raised in [place].  He do not remember much about his past.  He has 3 kids from his wife.  He has a long relationship with her and got married 2 years ago.  He reported his wife is supportive.  Together they have 40-year-old, 18 and 38 year old.  He used to work in the past but currently on disability since 2012.    Legal History; Denies any current legal issues.  History Of Abuse; Don't remember.  Substance  Abuse History; No current h/o substance use.   Neurologic: Headache: Yes Seizure: No Paresthesias: No   Outpatient Encounter Medications as of 12/21/2019  Medication Sig  . cyclobenzaprine (FLEXERIL) 10 MG tablet Take 1 tablet (10 mg total) by mouth 3 (three) times daily as needed for muscle spasms. Take mostly at bedtime   No facility-administered encounter medications on file as of 12/21/2019.    Recent Results (from the past 2160 hour(s))  CBC     Status: None   Collection Time: 10/21/19  3:25 PM  Result Value Ref Range   WBC 5.2 4.0 - 10.5 K/uL   RBC 5.40 4.22 - 5.81 Mil/uL   Platelets 180.0 150.0 - 400.0 K/uL   Hemoglobin 16.1 13.0 - 17.0 g/dL   HCT 48.6 39.0 - 52.0 %   MCV 90.1 78.0 - 100.0 fl   MCHC 33.1 30.0 - 36.0 g/dL   RDW 13.4 11.5 - 15.5 %  TSH     Status: None   Collection Time: 10/21/19  3:25 PM  Result Value Ref Range   TSH 1.70 0.35 - 4.50 uIU/mL  Lipid panel     Status: None   Collection Time: 10/21/19  3:25 PM  Result Value Ref Range   Cholesterol 152 0 - 200 mg/dL    Comment: ATP III Classification       Desirable:  < 200 mg/dL               Borderline High:  200 - 239 mg/dL          High:  > = 240 mg/dL   Triglycerides 101.0 0.0 - 149.0 mg/dL    Comment: Normal:  <150 mg/dLBorderline High:  150 - 199 mg/dL   HDL 39.20 >39.00 mg/dL   VLDL 20.2 0.0 - 40.0 mg/dL   LDL Cholesterol 93 0 - 99 mg/dL   Total CHOL/HDL Ratio 4     Comment:                Men          Women1/2 Average Risk     3.4          3.3Average Risk          5.0          4.42X Average Risk          9.6          7.13X Average Risk          15.0          11.0                       NonHDL 112.72     Comment: NOTE:  Non-HDL goal should be 30 mg/dL higher than patient's LDL goal (i.e. LDL goal of < 70 mg/dL, would have non-HDL goal of < 100 mg/dL)  Vitamin B12     Status: None   Collection Time: 10/21/19  3:25 PM  Result Value Ref Range   Vitamin B-12 320 211 - 911 pg/mL  Comprehensive  metabolic panel     Status: None   Collection Time: 10/21/19  3:25 PM  Result Value Ref Range   Sodium 142 135 - 145 mEq/L   Potassium 4.5 3.5 - 5.1 mEq/L   Chloride 106 96 - 112 mEq/L   CO2 28 19 - 32 mEq/L   Glucose, Bld 92 70 - 99 mg/dL   BUN 8 6 - 23 mg/dL   Creatinine, Ser 1.44 0.40 - 1.50 mg/dL   Total Bilirubin 0.5 0.2 - 1.2 mg/dL   Alkaline Phosphatase 93 39 - 117 U/L   AST 21 0 - 37 U/L   ALT 37 0 - 53 U/L   Total Protein 6.7 6.0 - 8.3 g/dL   Albumin 4.4 3.5 - 5.2 g/dL   GFR 81.85 >63.14 mL/min   Calcium 9.5 8.4 - 10.5 mg/dL  Novel Coronavirus, NAA ([facility])     Status: None   Collection Time: 12/03/19  3:55 PM   Specimen: Nasopharyngeal(NP) swabs in vial transport medium   NASOPHARYNGE  TESTING  Result Value Ref Range   SARS-CoV-2, NAA Not Detected Not Detected    Comment: This nucleic acid amplification test was developed and its performance characteristics determined by [employer]. Nucleic acid amplification tests include RT-PCR and TMA. This test has not been FDA cleared or approved. This test has been authorized by FDA under an Emergency Use Authorization (EUA). This test is only authorized for the duration of time the declaration that circumstances exist justifying the authorization of the emergency use of in vitro diagnostic tests for detection of SARS-CoV-2 virus and/or diagnosis of COVID-19 infection under section 564(b)(1) of the Act, 21 U.S.C. 970YOV-7(C) (1), unless the authorization is terminated or revoked sooner. When diagnostic testing is negative, the possibility of a false negative result should be considered in the context of a patient's recent exposures and the presence of clinical signs and symptoms consistent with COVID-19. An individual without symptoms of COVID-19 and who is not shedding SARS-CoV-2 virus wo uld expect to have a negative (not detected) result in this assay.   SARS-COV-2, NAA 2 DAY TAT     Status: None   Collection  Time: 12/03/19  3:55 PM   NASOPHARYNGE  TESTING  Result Value Ref Range   SARS-CoV-2, NAA 2 DAY TAT Performed       Constitutional:  Wt 269 lb (122 kg)   BMI 36.48 kg/m    Musculoskeletal: Strength & Muscle Tone: within normal limits Gait & Station: normal Patient leans: N/A  Psychiatric Specialty Exam: Physical Exam  Review of Systems  Musculoskeletal: Positive for back pain.    There were no vitals taken for this visit.There is no height or weight on file to calculate BMI.  General Appearance: Fairly Groomed  Eye Contact:  Fair  Speech:  Slow  Volume:  Decreased  Mood:  Anxious, Depressed and Hopeless  Affect:  Constricted  Thought Process:  Descriptions of Associations: Intact  Orientation:  Full (Time, Place, and Person)  Thought Content:  Hallucinations: Auditory hear conversation from people but no command hallucination., Paranoid Ideation, Rumination and trust issues  Suicidal Thoughts:  passive fleeting thoughts but no plan  Homicidal Thoughts:  No  Memory:  Immediate;   Good Recent;   Good Remote;   Fair  Judgement:  Fair  Insight:  Fair  Psychomotor Activity:  Decreased  Concentration:  Concentration: Fair and Attention Span: Fair  Recall:  Jennelle Human of Knowledge:  Fair  Language:  Good  Akathisia:  No  Handed:  Right  AIMS (if indicated):     Assets:  Communication Skills Desire for Improvement Housing Resilience Social Support  ADL's:  Intact  Cognition:  WNL  Sleep:   6-8 hrs     Assessment/Plan:  Madox is 39 year old African-American man with a history of bipolar disorder, anxiety, drug use and psychosis.  Currently he is sober.  I reviewed his blood work and history.  He like to go back on medication.  He remembers Seroquel did help but caused sedation and pathology.  I recommend to try Abilify which she has taken in the past but could not afford due to expense.  Like to go back on Abilify since it did help in the past.  We will try  5 mg and he will take half tablet for 1 week and then full tablet daily.  He also recall good response with Wellbutrin and we will start Wellbutrin XL 150 mg daily.  I discussed medication side effects and benefits.  Like to start Ritalin however I recommend that he should wait until the current medicine did work.  I explained most of the time lack of attention and focus is due to his underlying chronic mental illness.  I do recommend that he should see a therapist for coping skills and we will provide either appointment or name of the therapist for counseling.  I also discussed safety concern that anytime having active suicidal thoughts or homicidal thought that he need to call 911 or go to local emergency room.  Follow-up in 3 to 4 weeks.  Cleotis Nipper, MD 12/21/2019          Follow Up Instructions:    I discussed the assessment and treatment plan with the patient. The patient was provided an opportunity to ask questions and all were answered. The patient agreed with the plan and demonstrated an understanding of the instructions.   The patient was advised to call back or seek an in-person evaluation if the symptoms worsen or if the condition fails to improve as anticipated.  I provided 55 minutes of non-face-to-face time during this encounter.   Cleotis Nipper, MD

## 2019-12-22 ENCOUNTER — Ambulatory Visit (INDEPENDENT_AMBULATORY_CARE_PROVIDER_SITE_OTHER): Payer: Medicare Other | Admitting: Family Medicine

## 2019-12-22 ENCOUNTER — Other Ambulatory Visit: Payer: Self-pay

## 2019-12-22 ENCOUNTER — Encounter: Payer: Self-pay | Admitting: Family Medicine

## 2019-12-22 VITALS — BP 120/86 | HR 81 | Ht 72.0 in | Wt 266.0 lb

## 2019-12-22 DIAGNOSIS — G8929 Other chronic pain: Secondary | ICD-10-CM

## 2019-12-22 DIAGNOSIS — M4124 Other idiopathic scoliosis, thoracic region: Secondary | ICD-10-CM | POA: Diagnosis not present

## 2019-12-22 DIAGNOSIS — M545 Low back pain: Secondary | ICD-10-CM

## 2019-12-22 DIAGNOSIS — M546 Pain in thoracic spine: Secondary | ICD-10-CM | POA: Diagnosis not present

## 2019-12-22 NOTE — Progress Notes (Signed)
    Donald Sweeney is a 39 y.o. male who presents to [facility] at [facility] today for follow up of L sided low back pain. Patient was last seen by Dr. Denyse Amass on 11/11/2019 where patient stated He locates his pain to to his lower mid-back that radiates to his upper back.  He rates his pain as mod-severe and describes his pain as an intermittent sharp/aching pain. Radiating pain: No Aggravating factors: Prolonged standing, sitting, forward flexion; playing w his kids Treatments tried: Naproxen, Tylenol, topical pain relieving gel/cream He has 3 children.  His oldest is a 42 year old son who has cerebral palsy.  He has to take his son out of his wheelchair frequently.  His son weighs about 90 pounds. His youngest child is 66 years old and has autism and OCD.  Since last visit patient reports back is feeling better. States not at 100% but feeling worlds better.    Pertinent review of systems: No fevers or chills  Relevant historical information: History of depression   Exam:  BP 120/86 (BP Location: Left Arm, Patient Position: Sitting, Cuff Size: Large)   Pulse 81   Ht 6' (1.829 m)   Wt 266 lb (120.7 kg)   SpO2 97%   BMI 36.08 kg/m  General: Well Developed, well nourished, and in no acute distress.   MSK: T-spine normal-appearing nontender normal motion.    Lab and Radiology Results DG Thoracic Spine 2 View  Result Date: 11/11/2019 CLINICAL DATA:  Back pain EXAM: THORACIC SPINE 2 VIEWS COMPARISON:  Chest x-ray 12/04/2016 FINDINGS: Reverse S shaped scoliosis of the upper thoracic spine. Sagittal alignment within normal limits. Vertebral body heights are maintained. IMPRESSION: Scoliosis.  No acute osseous abnormality. Electronically Signed   By: Jasmine Pang M.D.   On: 11/11/2019 23:22   DG Lumbar Spine 2-3 Views  Result Date: 11/11/2019 CLINICAL DATA:  Back pain fell down stairs EXAM: LUMBAR SPINE - 2-3 VIEW COMPARISON:  None. FINDINGS: There is no evidence  of lumbar spine fracture. Alignment is normal. Intervertebral disc spaces are maintained. IMPRESSION: Negative. Electronically Signed   By: Jasmine Pang M.D.   On: 11/11/2019 23:22   I, Clementeen Graham, personally (independently) visualized and performed the interpretation of the images attached in this note.     Assessment and Plan: 39 y.o. male with thoracic back pain.  Significant improvement with physical therapy.  Plan to continue home exercise program and limited PT.  Check back with me as needed in the future for this or other issues.  Discussed scoliosis and x-ray findings.     Discussed warning signs or symptoms. Please see discharge instructions. Patient expresses understanding.   The above documentation has been reviewed and is accurate and complete Clementeen Graham, M.D.

## 2019-12-22 NOTE — Patient Instructions (Signed)
Thank you for coming in today. Plan for Continue home exercises.  Let me know if you need a repeat order for PT in the future.  OK to try some chiropractor if needed. Recheck with me in the future for this or other issues.

## 2019-12-24 ENCOUNTER — Ambulatory Visit: Payer: Medicare Other

## 2019-12-27 ENCOUNTER — Ambulatory Visit: Payer: Medicare Other

## 2019-12-31 ENCOUNTER — Other Ambulatory Visit: Payer: Self-pay

## 2019-12-31 ENCOUNTER — Ambulatory Visit: Payer: Medicare Other

## 2019-12-31 DIAGNOSIS — M546 Pain in thoracic spine: Secondary | ICD-10-CM

## 2019-12-31 DIAGNOSIS — M545 Low back pain, unspecified: Secondary | ICD-10-CM

## 2019-12-31 DIAGNOSIS — M6289 Other specified disorders of muscle: Secondary | ICD-10-CM

## 2020-01-02 NOTE — Therapy (Addendum)
[place], [place], [address] Phone: [phone]   Fax:  [phone]  Physical Therapy Treatment/DC Summary   Patient Details  Name: Donald Sweeney MRN: [id number] Date of Birth: 05-14-1981 Referring Provider (PT): Gregor Hams, MD   Encounter Date: 12/31/2019   PT End of Session - 01/02/20 1647    Visit Number 4    Number of Visits 13    Date for PT Re-Evaluation 01/11/20    Authorization Type Medicare    Progress Note Due on Visit 10    PT Start Time 1218    PT Stop Time 1303    PT Time Calculation (min) 45 min    Activity Tolerance Patient tolerated treatment well    Behavior During Therapy [facility] for tasks assessed/performed           Past Medical History:  Diagnosis Date  . Frequent headaches   . GERD (gastroesophageal reflux disease)   . History of fainting spells of unknown cause   . Hyperlipidemia   . Hypertension     No past surgical history on file.  There were no vitals filed for this visit.   Subjective Assessment - 01/02/20 1648    Subjective Pt reports his mid back pain is continuing to improve. Currently rates a 0/10. Pt states he is having to lift is son more, but the incidences of pain are less often and severe.    Currently in Pain? No/denies    Pain Location Back    Pain Orientation Posterior;Mid    Pain Frequency Intermittent                             [facility] Adult PT Treatment/Exercise - 01/02/20 0001      Lumbar Exercises: Stretches   Lower Trunk Rotation 3 reps;10 seconds    Quadruped Mid Back Stretch 3 reps;20 seconds;10 seconds    Quadruped Mid Back Stretch Limitations Cat 3 reps; 10 s      Lumbar Exercises: Aerobic   Nustep 5 mins; L5; arms and legs      Lumbar Exercises: Standing   Wall Slides 15 reps;2 seconds    Scapular Retraction Strengthening;Both;15 reps;Theraband    Theraband Level (Scapular Retraction) Level 3 (Green)     Shoulder Extension Strengthening;15 reps;Theraband    Theraband Level (Shoulder Extension) Level 3 (Green)    Other Standing Lumbar Exercises Bicep curl; 15 reps; green Tband      Lumbar Exercises: Supine   Pelvic Tilt 15 reps    Pelvic Tilt Limitations 3 sec    Bent Knee Raise 15 reps;2 seconds    Bent Knee Raise Limitations each LE    Bridge 15 reps    Bridge Limitations c abs      Lumbar Exercises: Sidelying   Clam Right;Left;15 reps;2 seconds    Hip Abduction Right;Left;15 reps;2 seconds                  PT Education - 01/02/20 1643    Education Details HEP: back strengthening exs c T band, 90/90 ab heel touches, wall slide mini squats.    Person(s) Educated Patient    Methods Demonstration;Tactile cues;Verbal cues;Handout;Explanation    Comprehension Verbalized understanding;Returned demonstration;Verbal cues required;Tactile cues required;Need further instruction            PT Short Term Goals - 11/16/19 1244      PT SHORT TERM GOAL #1  Title Pt will be Ind in a HEP to address mid to low back tightness, strength and pain.    Baseline No program    Time 3    Period Weeks    Status New    Target Date 12/07/19      PT SHORT TERM GOAL #2   Title Pt will demonstrate appropriate body mechanics for household activities and lifting.    Baseline Limited    Time 3    Period Weeks    Status New    Target Date 12/07/19             PT Long Term Goals - 11/16/19 1250      PT LONG TERM GOAL #1   Title Pt will report being able to complete household activities c little difficulty    Baseline extreme difficulty    Time 8    Period Weeks    Status New    Target Date 01/11/20      PT LONG TERM GOAL #2   Title Pt will be able to complete lifting of his son c moderate difficulty    Baseline extreme difficulty    Time 8    Period Weeks    Status New    Target Date 01/11/20      PT LONG TERM GOAL #3   Title Pt will report improved pain range from 2-8/10  to 0-4 with daily activities    Baseline 2-8/10    Time 8    Period Weeks    Status New    Target Date 01/11/20      PT LONG TERM GOAL #4   Title Pt's FOTO limitation measure will improve to predicted of 46%    Baseline 65% on eval    Time 8    Period Weeks    Status New    Target Date 01/11/20      PT LONG TERM GOAL #5   Title Pt will be Ind with a final HEP    Baseline No program    Time 8    Period Weeks    Status New    Target Date 01/11/20                 Plan - 01/02/20 1653    Clinical Impression Statement Pt's PT program was progressed with back Tband, ab, and squatting exs. Subjective report indicates continued improvement despite increased incidences of needing to lift his son.    Comorbidities HTN    Examination-Activity Limitations Caring for Others;Carry;Lift;Reach Overhead    Stability/Clinical Decision Making Stable/Uncomplicated    Clinical Decision Making Low    Rehab Potential Good    PT Frequency 2x / week    PT Duration 6 weeks    PT Treatment/Interventions ADLs/Self Care Home Management;Cryotherapy;Electrical Stimulation;Ultrasound;Traction;Moist Heat;Iontophoresis 90m/ml Dexamethasone;Functional mobility training;Therapeutic activities;Therapeutic exercise;Neuromuscular re-education;Manual techniques;Patient/family education;Dry needling;Taping    PT Next Visit Plan Review proper body mchanics for household activities    PT Home Exercise Plan [id number]. Back strengthening exs c T band, 90/90 ab heel touches, wall slide mini squats.    Consulted and Agree with Plan of Care Patient           Patient will benefit from skilled therapeutic intervention in order to improve the following deficits and impairments:  Improper body mechanics, Decreased knowledge of precautions, Hypermobility, Pain, Obesity, Impaired UE functional use, Increased muscle spasms  Visit Diagnosis: Pain in thoracic spine  Lumbar back pain  Muscle tightness  Problem  List Patient Active Problem List   Diagnosis Date Noted  . Other idiopathic scoliosis, thoracic region 11/11/2019  . Loud snoring 11/23/2018  . History of night terrors 11/23/2018  . Hypermobility of joint 11/16/2018  . Fatigue 11/16/2018  . Major depression in remission ([place]) 11/16/2018  . Cubital tunnel syndrome, bilateral 04/14/2018    Gar Ponto MS, PT 01/02/20 5:12 PM   PHYSICAL THERAPY DISCHARGE SUMMARY  Visits from Start of Care: 4  Current functional level related to goals / functional outcomes: Unknown- Pt self DCed   Remaining deficits: Unknown- Pt self DCed   Education / Equipment: HEP Plan:                                                    Patient goals were not met. Patient is being discharged due to not returning since the last visit.  ?????       [place], [place], [address] Phone: [phone]   Fax:  [phone]  Name: Donald Sweeney MRN: [id number] Date of Birth: 1981-06-24

## 2020-01-03 ENCOUNTER — Ambulatory Visit: Payer: Medicare Other

## 2020-01-07 ENCOUNTER — Ambulatory Visit: Payer: Medicare Other

## 2020-01-19 ENCOUNTER — Other Ambulatory Visit: Payer: Self-pay

## 2020-01-19 ENCOUNTER — Telehealth (INDEPENDENT_AMBULATORY_CARE_PROVIDER_SITE_OTHER): Payer: Medicare Other | Admitting: Psychiatry

## 2020-01-19 DIAGNOSIS — F419 Anxiety disorder, unspecified: Secondary | ICD-10-CM

## 2020-01-19 DIAGNOSIS — F319 Bipolar disorder, unspecified: Secondary | ICD-10-CM | POA: Diagnosis not present

## 2020-01-19 MED ORDER — ARIPIPRAZOLE 5 MG PO TABS: 5.0000 mg | ORAL_TABLET | Freq: Every day | ORAL | 1 refills | Status: AC

## 2020-01-19 MED ORDER — BUPROPION HCL ER (XL) 300 MG PO TB24: 300.0000 mg | ORAL_TABLET | Freq: Every day | ORAL | 1 refills | Status: AC

## 2020-01-19 NOTE — Progress Notes (Signed)
Virtual Visit via Telephone Note  I connected with Donald Sweeney on 01/19/20 at  2:20 PM EDT by telephone and verified that I am speaking with the correct person using two identifiers.  Location: Patient: home Provider: Home office   I discussed the limitations, risks, security and privacy concerns of performing an evaluation and management service by telephone and the availability of in person appointments. I also discussed with the patient that there may be a patient responsible charge related to this service. The patient expressed understanding and agreed to proceed.   History of Present Illness: Donald Sweeney is 39 year old African-American man who was seen first time 4 weeks ago.  He had a history of bipolar disorder, ADD and anxiety.  We started him on Abilify and Wellbutrin.  He is taking his medication as prescribed and he noticed improvement in his mood, irritability, anger and suicidal thoughts.  He is not agitated or violent and does not screen has much as he used to in the past.  He lives with his wife who works and 3 kids who are 554 and 26 years old.  Patient is on disability.  Though his psychiatric symptoms are much improved but he still struggles with attention and focus.  He is requesting to go back on Ritalin.  He also like something to stop smoking.  So far he is tolerating his medication and reported no tremors, shakes or any EPS.  We have recommended to see a therapist but he was not able to schedule appointment with therapist.  Patient told he had called few places and waiting for callback.  He is not drinking or using any illegal substances.   He reported his appetite is okay tand he sleeps at least 6 to 8 hours.     Past Psychiatric History: H/O inpatient in 2012 after taking overdose on pain medicine and alcohol. H/O paranoia, trust issue, hallucination, suicidal attempt, anger and poor impulse control. Tried lithium, Lamictal, Adderall, Ritalin, Seroquel, Tegretol and  Abilify. Saw Dr. Dub Mikes and Dr. Toni Arthurs in the past.  Did IOP in 2012.   Psychiatric Specialty Exam: Physical Exam  Review of Systems  There were no vitals taken for this visit.There is no height or weight on file to calculate BMI.  General Appearance: NA  Eye Contact:  NA  Speech:  Slow  Volume:  Decreased  Mood:  Dysphoric  Affect:  NA  Thought Process:  Descriptions of Associations: Intact  Orientation:  Full (Time, Place, and Person)  Thought Content:  Paranoid Ideation and Rumination  Suicidal Thoughts:  No  Homicidal Thoughts:  No  Memory:  Immediate;   Good Recent;   Good Remote;   Fair  Judgement:  Intact  Insight:  Present  Psychomotor Activity:  NA  Concentration:  Concentration: Fair and Attention Span: Fair  Recall:  Good  Fund of Knowledge:  Good  Language:  Good  Akathisia:  No  Handed:  Right  AIMS (if indicated):     Assets:  Communication Skills Desire for Improvement Housing Resilience Social Support  ADL's:  Intact  Cognition:  WNL  Sleep:   better      Assessment and Plan: Bipolar disorder type I.  Anxiety.  ADHD by history.  Patient doing better from the past.  Try recommend to increase Wellbutrin to 300 to help his focus, attention and also it may help to stop smoking.  He agreed with the plan.  We will continue Abilify 5 mg daily since he has no side  effects.  He like to have a therapist appointment in our office and we will look for the therapy appointment..  However if his attention concentration do not improve with the higher dose of Wellbutrin we will consider Strattera.  I recommend to call us back if is any question, concern or if he feels worsening of the symptom.  Follow-up in 6 weeks.  Time spent 25 minutes.  Follow Up Instructions:    I discussed the assessment and treatment plan with the patient. The patient was provided an opportunity to ask questions and all were answered. The patient agreed with the plan and demonstrated an  understanding of the instructions.   The patient was advised to call back or seek an in-person evaluation if the symptoms worsen or if the condition fails to improve as anticipated.  I provided 25 minutes of non-face-to-face time during this encounter.   Cleotis Nipper, MD

## 2020-02-07 ENCOUNTER — Ambulatory Visit (HOSPITAL_COMMUNITY): Payer: Medicare Other | Admitting: Licensed Clinical Social Worker

## 2020-02-07 ENCOUNTER — Other Ambulatory Visit: Payer: Self-pay

## 2020-03-21 ENCOUNTER — Telehealth (HOSPITAL_COMMUNITY): Payer: Medicare Other | Admitting: Psychiatry

## 2020-03-24 ENCOUNTER — Telehealth (HOSPITAL_COMMUNITY): Payer: Medicare Other | Admitting: Psychiatry

## 2020-11-29 IMAGING — DX DG THORACIC SPINE 2V
2 series · 2 of 2 positions shown · non-contrast
Comparison: Chest x-ray 12/04/2016

CLINICAL DATA: Back pain

EXAM:
THORACIC SPINE 2 VIEWS

[thoracic spine ap]
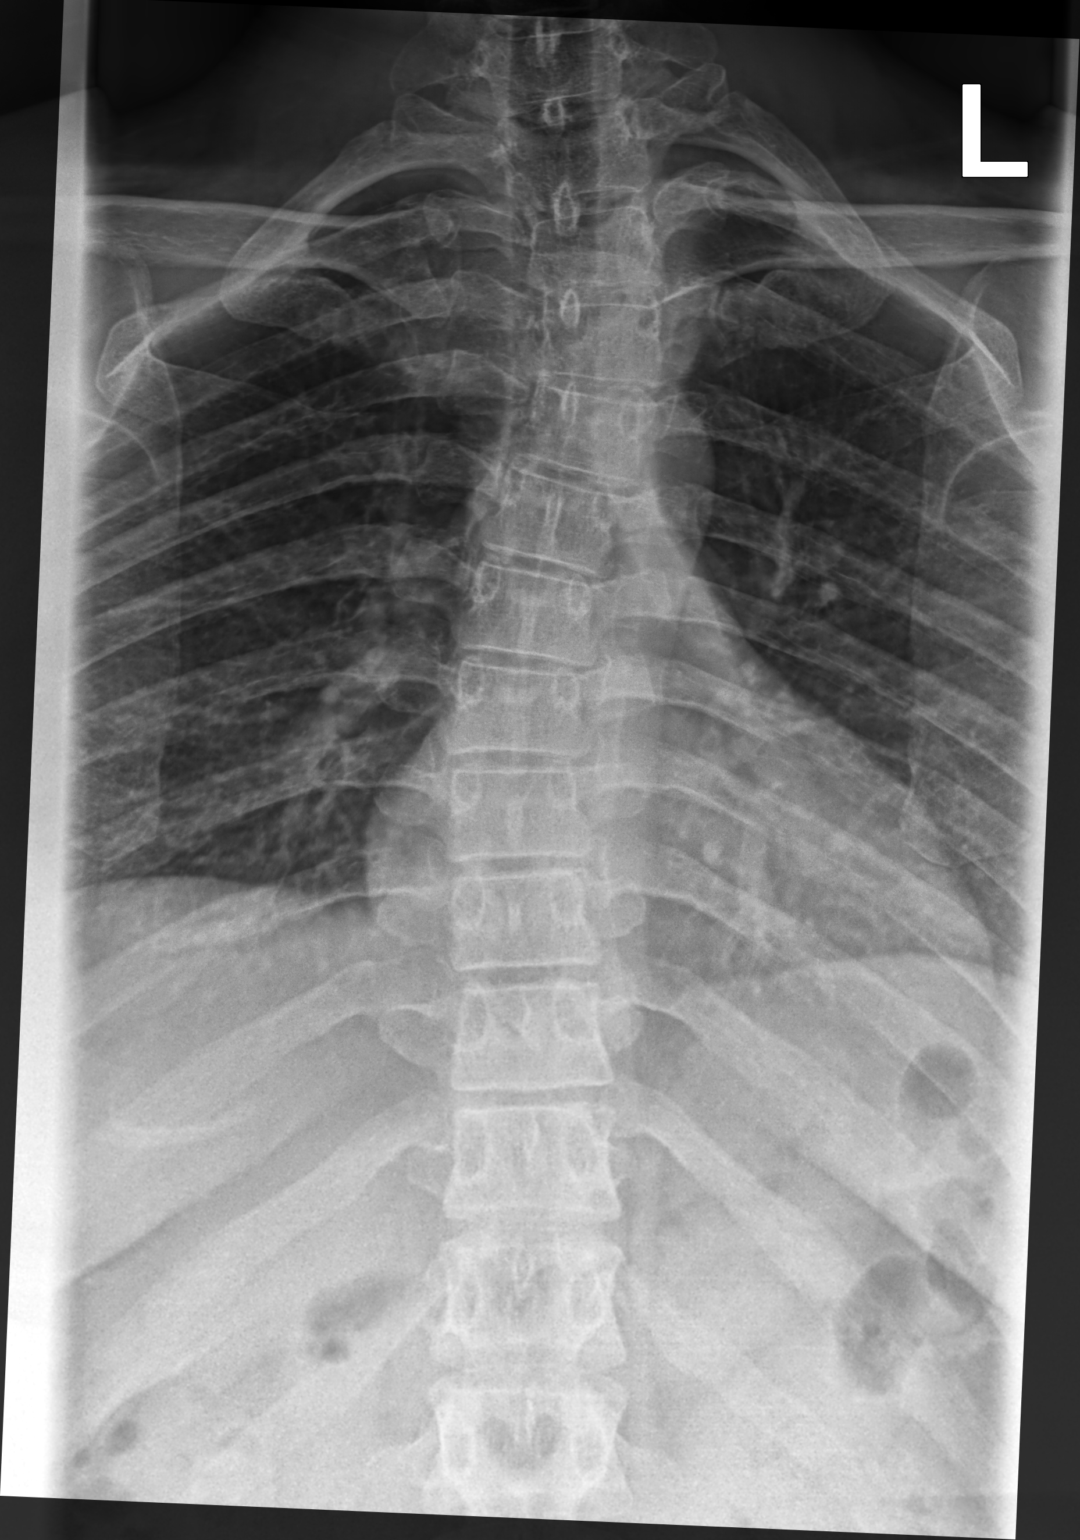

[thoracic spine standing lat]
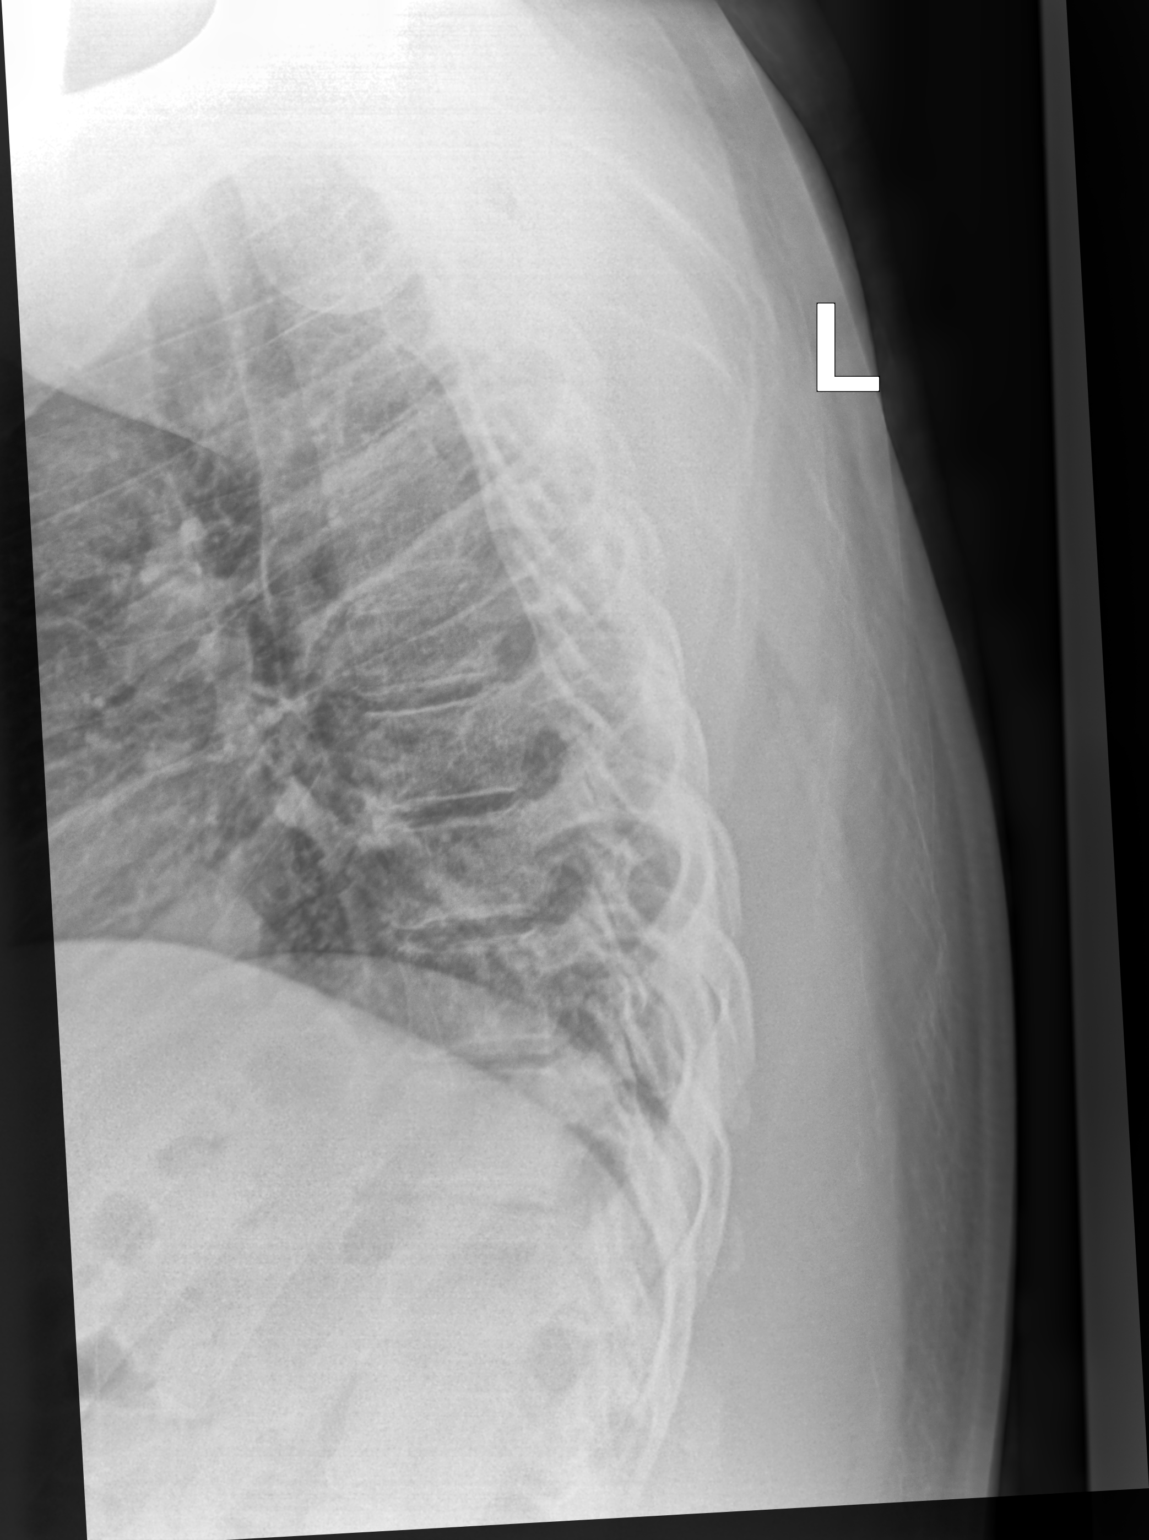

[2 of 2 positions shown; findings below may reference images not displayed]

FINDINGS: Reverse S shaped scoliosis of the upper thoracic spine. Sagittal
alignment within normal limits. Vertebral body heights are
maintained.
IMPRESSION: Scoliosis.  No acute osseous abnormality.

## 2020-11-29 IMAGING — DX DG LUMBAR SPINE 2-3V
3 series · 3 of 3 positions shown · non-contrast
Comparison: None.

CLINICAL DATA: Back pain fell down stairs

EXAM:
LUMBAR SPINE - 2-3 VIEW

[lumbar spine ap]
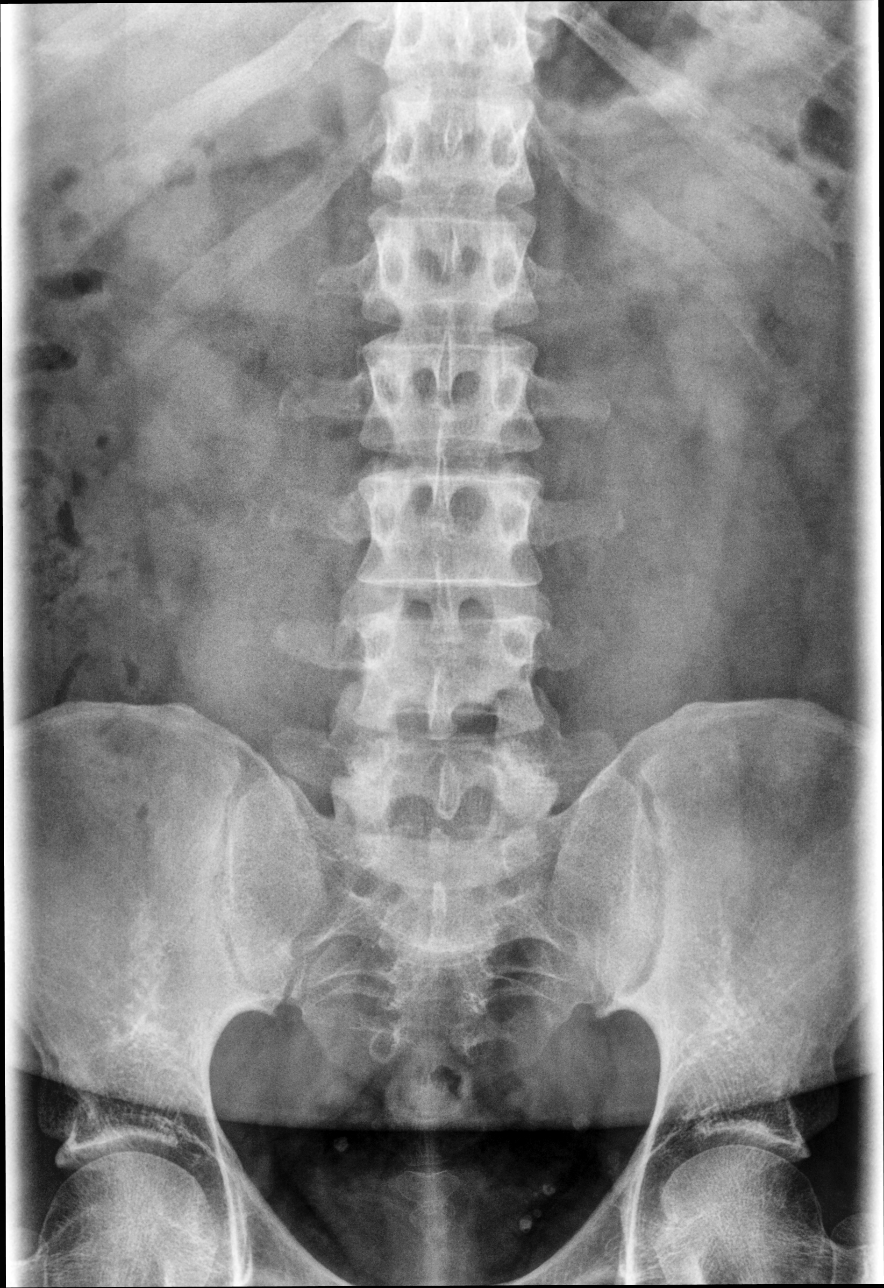

[lumbar spine lat (1 of 2)]
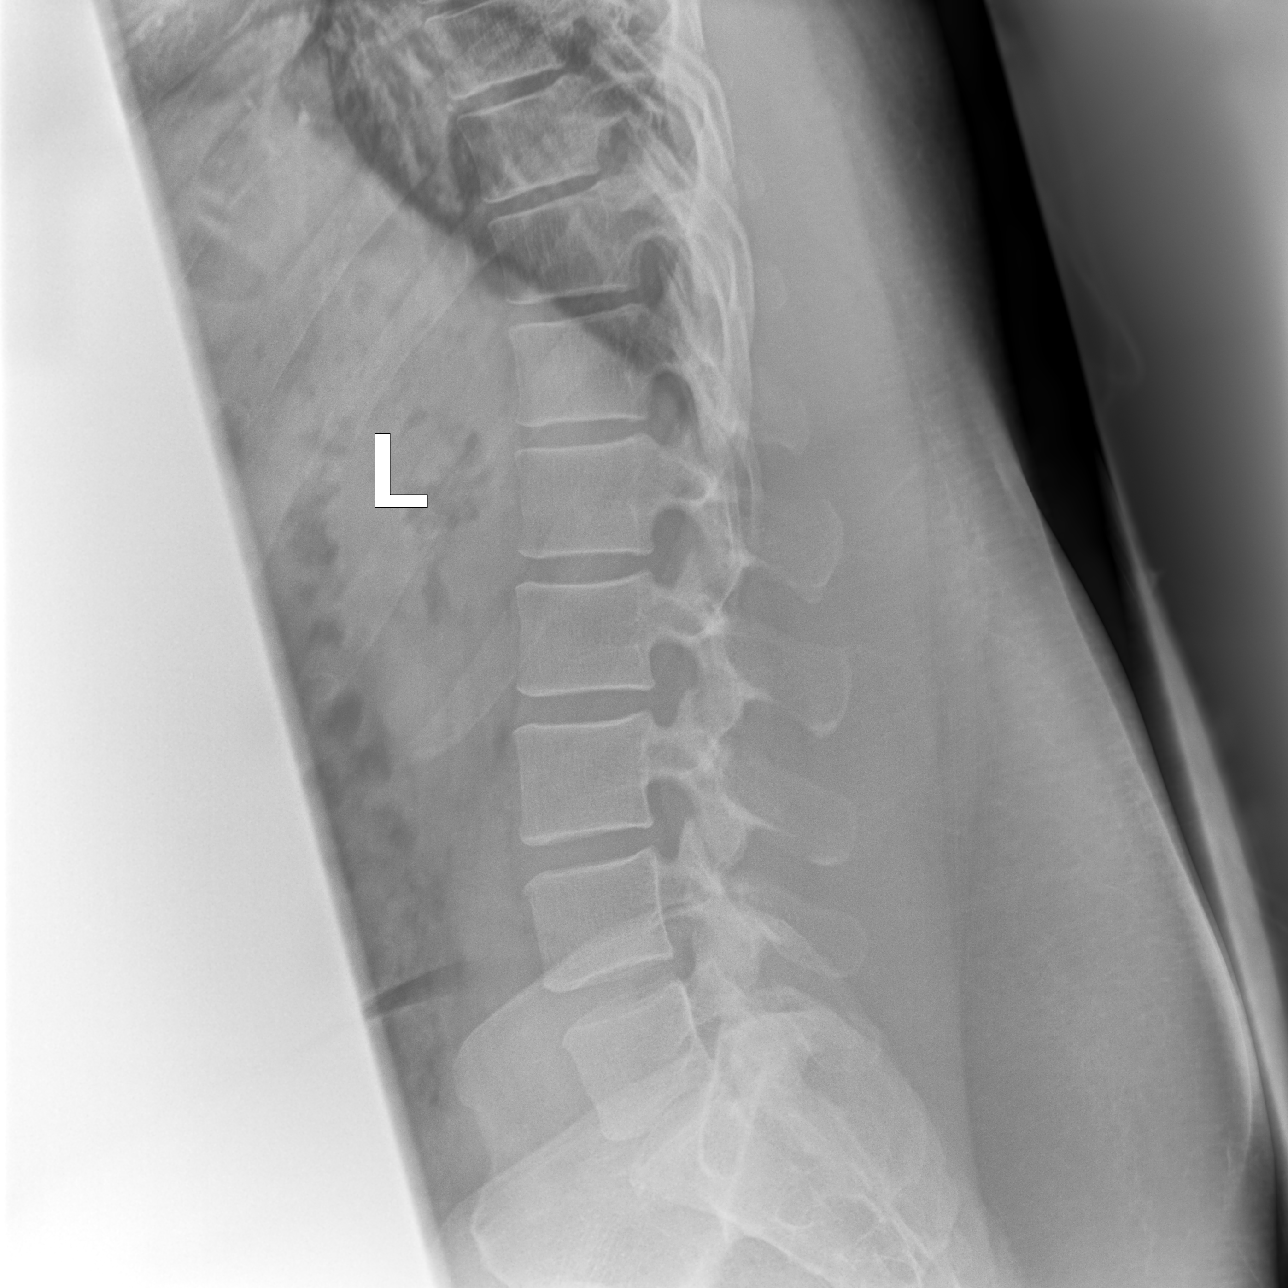

[lumbar spine lat (2 of 2)]
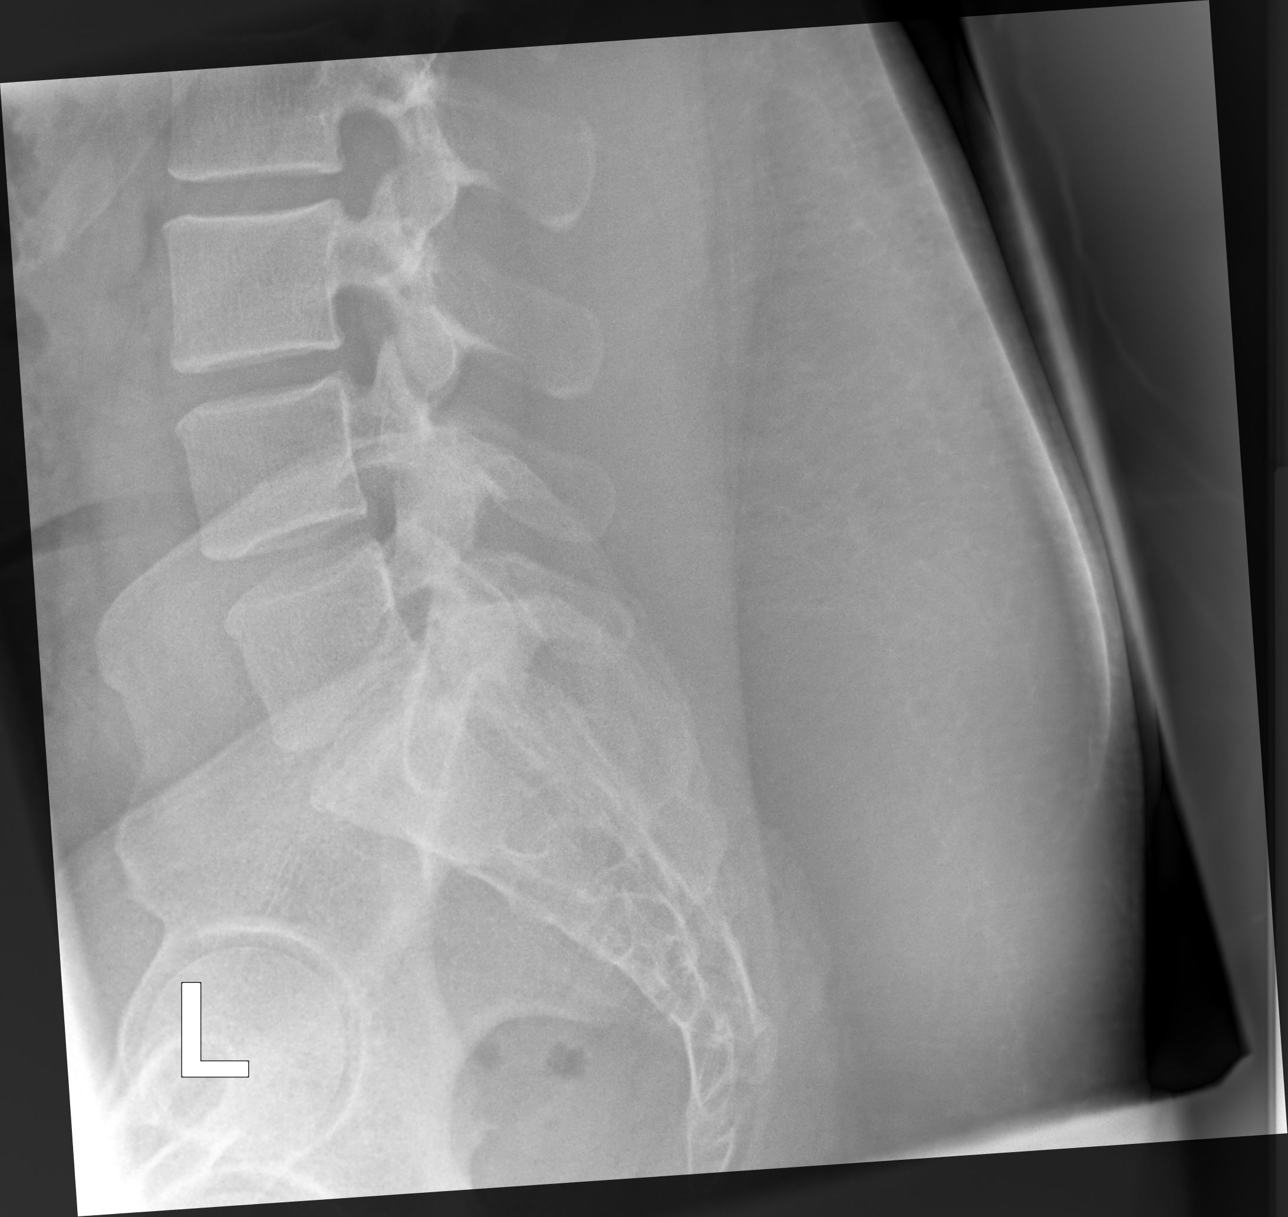

[3 of 3 positions shown; findings below may reference images not displayed]

FINDINGS: There is no evidence of lumbar spine fracture. Alignment is normal.
Intervertebral disc spaces are maintained.
IMPRESSION: Negative.

## 2021-06-04 DIAGNOSIS — Z0000 Encounter for general adult medical examination without abnormal findings: Secondary | ICD-10-CM | POA: Diagnosis not present

## 2021-06-04 DIAGNOSIS — Z23 Encounter for immunization: Secondary | ICD-10-CM | POA: Diagnosis not present

## 2021-06-04 DIAGNOSIS — E876 Hypokalemia: Secondary | ICD-10-CM | POA: Diagnosis not present

## 2021-06-04 DIAGNOSIS — Z6838 Body mass index (BMI) 38.0-38.9, adult: Secondary | ICD-10-CM | POA: Diagnosis not present

## 2021-06-04 DIAGNOSIS — E669 Obesity, unspecified: Secondary | ICD-10-CM | POA: Diagnosis not present

## 2021-06-04 DIAGNOSIS — Z13220 Encounter for screening for lipoid disorders: Secondary | ICD-10-CM | POA: Diagnosis not present

## 2021-06-04 DIAGNOSIS — R000 Tachycardia, unspecified: Secondary | ICD-10-CM | POA: Diagnosis not present

## 2021-06-04 DIAGNOSIS — I10 Essential (primary) hypertension: Secondary | ICD-10-CM | POA: Diagnosis not present

## 2021-06-04 DIAGNOSIS — Z1331 Encounter for screening for depression: Secondary | ICD-10-CM | POA: Diagnosis not present

## 2021-06-04 DIAGNOSIS — R030 Elevated blood-pressure reading, without diagnosis of hypertension: Secondary | ICD-10-CM | POA: Diagnosis not present

## 2021-06-04 DIAGNOSIS — Z131 Encounter for screening for diabetes mellitus: Secondary | ICD-10-CM | POA: Diagnosis not present

## 2021-06-04 DIAGNOSIS — F331 Major depressive disorder, recurrent, moderate: Secondary | ICD-10-CM | POA: Diagnosis not present

## 2022-04-08 ENCOUNTER — Encounter: Payer: Self-pay | Admitting: *Deleted

## 2022-06-27 ENCOUNTER — Encounter: Payer: Self-pay | Admitting: *Deleted
